# Patient Record
Sex: Female | Born: 1945 | Race: Black or African American | Hispanic: No | Marital: Married | Smoking: Never smoker
Health system: Southern US, Community
[De-identification: ages and names within clinical notes are randomized; demographics above are authoritative.]

## PROBLEM LIST (undated history)

## (undated) DIAGNOSIS — Z87442 Personal history of urinary calculi: Secondary | ICD-10-CM

## (undated) DIAGNOSIS — I10 Essential (primary) hypertension: Secondary | ICD-10-CM

## (undated) DIAGNOSIS — J45909 Unspecified asthma, uncomplicated: Secondary | ICD-10-CM

## (undated) DIAGNOSIS — G4730 Sleep apnea, unspecified: Secondary | ICD-10-CM

## (undated) DIAGNOSIS — M1990 Unspecified osteoarthritis, unspecified site: Secondary | ICD-10-CM

## (undated) DIAGNOSIS — J189 Pneumonia, unspecified organism: Secondary | ICD-10-CM

---

## 1968-05-18 HISTORY — PX: CHOLECYSTECTOMY: SHX55

## 1980-05-18 HISTORY — PX: BLADDER NECK SUSPENSION: SHX1240

## 1980-05-18 HISTORY — PX: ABDOMINAL HYSTERECTOMY: SHX81

## 1991-05-19 HISTORY — PX: BREAST SURGERY: SHX581

## 2010-05-18 HISTORY — PX: JOINT REPLACEMENT: SHX530

## 2010-12-29 ENCOUNTER — Encounter (HOSPITAL_COMMUNITY)
Admission: RE | Admit: 2010-12-29 | Discharge: 2010-12-29 | Disposition: A | Discharge status: Home / Self Care | Source: Ambulatory Visit | Attending: Orthopedic Surgery | Admitting: Orthopedic Surgery

## 2010-12-29 LAB — URINALYSIS, ROUTINE W REFLEX MICROSCOPIC
Bilirubin Urine: NEGATIVE
Glucose, UA: NEGATIVE mg/dL
Ketones, ur: NEGATIVE mg/dL
Leukocytes, UA: NEGATIVE
Nitrite: NEGATIVE
Protein, ur: NEGATIVE mg/dL
pH: 6 (ref 5.0–8.0)

## 2010-12-29 LAB — DIFFERENTIAL
Basophils Absolute: 0.1 10*3/uL (ref 0.0–0.1)
Basophils Relative: 1 % (ref 0–1)
Eosinophils Absolute: 0.3 10*3/uL (ref 0.0–0.7)
Eosinophils Relative: 4 % (ref 0–5)
Monocytes Absolute: 0.8 10*3/uL (ref 0.1–1.0)
Monocytes Relative: 10 % (ref 3–12)
Neutro Abs: 3.3 10*3/uL (ref 1.7–7.7)

## 2010-12-29 LAB — CBC
MCH: 29.5 pg (ref 26.0–34.0)
MCHC: 33.1 g/dL (ref 30.0–36.0)
RDW: 12.8 % (ref 11.5–15.5)

## 2010-12-29 LAB — BASIC METABOLIC PANEL
BUN: 13 mg/dL (ref 6–23)
Calcium: 10.4 mg/dL (ref 8.4–10.5)
Creatinine, Ser: 0.72 mg/dL (ref 0.50–1.10)
GFR calc Af Amer: >60 mL/min (ref >60)

## 2010-12-29 LAB — PROTIME-INR
INR: 0.95 (ref 0.00–1.49)
Prothrombin Time: 12.9 seconds (ref 11.6–15.2)

## 2010-12-31 LAB — URINE CULTURE: Colony Count: >=100000

## 2011-01-06 ENCOUNTER — Inpatient Hospital Stay (HOSPITAL_COMMUNITY)

## 2011-01-06 ENCOUNTER — Inpatient Hospital Stay (HOSPITAL_COMMUNITY)
Admission: RE | Admit: 2011-01-06 | Discharge: 2011-01-10 | DRG: 470 | Disposition: A | Discharge status: Home / Self Care | Source: Ambulatory Visit | Attending: Orthopedic Surgery | Admitting: Orthopedic Surgery

## 2011-01-06 DIAGNOSIS — M1710 Unilateral primary osteoarthritis, unspecified knee: Principal | ICD-10-CM | POA: Diagnosis present

## 2011-01-06 DIAGNOSIS — J45909 Unspecified asthma, uncomplicated: Secondary | ICD-10-CM | POA: Diagnosis present

## 2011-01-06 DIAGNOSIS — M810 Age-related osteoporosis without current pathological fracture: Secondary | ICD-10-CM | POA: Diagnosis present

## 2011-01-06 DIAGNOSIS — Z01812 Encounter for preprocedural laboratory examination: Secondary | ICD-10-CM

## 2011-01-06 DIAGNOSIS — I10 Essential (primary) hypertension: Secondary | ICD-10-CM | POA: Diagnosis present

## 2011-01-06 LAB — ABO/RH: ABO/RH(D): O POS

## 2011-01-06 LAB — TYPE AND SCREEN: ABO/RH(D): O POS

## 2011-01-07 LAB — BASIC METABOLIC PANEL
CO2: 24 mEq/L (ref 19–32)
Chloride: 102 mEq/L (ref 96–112)
Glucose, Bld: 131 mg/dL — ABNORMAL HIGH (ref 70–99)
Potassium: 3.9 mEq/L (ref 3.5–5.1)
Sodium: 135 mEq/L (ref 135–145)

## 2011-01-07 LAB — CBC
Hemoglobin: 10.1 g/dL — ABNORMAL LOW (ref 12.0–15.0)
Platelets: 384 10*3/uL (ref 150–400)
RBC: 3.39 MIL/uL — ABNORMAL LOW (ref 3.87–5.11)
WBC: 11.7 10*3/uL — ABNORMAL HIGH (ref 4.0–10.5)

## 2011-01-07 LAB — PROTIME-INR: Prothrombin Time: 13.8 seconds (ref 11.6–15.2)

## 2011-01-08 LAB — BASIC METABOLIC PANEL
BUN: 8 mg/dL (ref 6–23)
CO2: 27 mEq/L (ref 19–32)
Chloride: 102 mEq/L (ref 96–112)
GFR calc non Af Amer: >60 mL/min (ref >60)
Glucose, Bld: 159 mg/dL — ABNORMAL HIGH (ref 70–99)
Potassium: 4 mEq/L (ref 3.5–5.1)
Sodium: 136 mEq/L (ref 135–145)

## 2011-01-08 LAB — CBC
HCT: 31.9 % — ABNORMAL LOW (ref 36.0–46.0)
Hemoglobin: 10.6 g/dL — ABNORMAL LOW (ref 12.0–15.0)
MCHC: 33.2 g/dL (ref 30.0–36.0)
RBC: 3.61 MIL/uL — ABNORMAL LOW (ref 3.87–5.11)
WBC: 12.2 10*3/uL — ABNORMAL HIGH (ref 4.0–10.5)

## 2011-01-08 LAB — PROTIME-INR
INR: 1.14 (ref 0.00–1.49)
Prothrombin Time: 14.8 seconds (ref 11.6–15.2)

## 2011-01-09 LAB — CBC
HCT: 29 % — ABNORMAL LOW (ref 36.0–46.0)
Hemoglobin: 9.8 g/dL — ABNORMAL LOW (ref 12.0–15.0)
MCH: 29.4 pg (ref 26.0–34.0)
RBC: 3.33 MIL/uL — ABNORMAL LOW (ref 3.87–5.11)

## 2011-01-09 LAB — PROTIME-INR
INR: 1.42 (ref 0.00–1.49)
Prothrombin Time: 17.6 seconds — ABNORMAL HIGH (ref 11.6–15.2)

## 2011-01-10 LAB — PROTIME-INR: INR: 1.68 — ABNORMAL HIGH (ref 0.00–1.49)

## 2011-01-28 NOTE — Discharge Summary (Signed)
  NAMETOY, SAMARIN                   ACCOUNT NO.:  [id number]  MEDICAL RECORD NO.:  [id number]  LOCATION:  5012                         FACILITY:  [facility]  PHYSICIAN:  Burnard Bunting, M.D.    DATE OF BIRTH:  [birth year]  DATE OF ADMISSION:  01/06/2011 DATE OF DISCHARGE:  01/10/2011                              DISCHARGE SUMMARY   DISCHARGE DIAGNOSES:  Left knee arthritis.  SECONDARY DIAGNOSES:  Asthma, morbid obesity.  OPERATIONS AND NOTABLE PROCEDURES:  Left total knee replacement performed on January 06, 2011.  HOSPITAL COURSE:  Alexa Carson is a 65 year old patient with left knee arthritis, underwent total knee replacement on January 06, 2011, tolerated procedure well without immediate complications.  Started on CPM for range of motion therapy for mobilization and Coumadin for DVT prophylaxis.  Had an uneventful recovery.  Hemoglobin was greater than 10 on the time of discharge.  INR was near therapeutic at the time of discharge.  She is discharged home in good condition, weightbearing as tolerated.  Incision was intact at time of discharge.  He will follow up with me in 10 days for suture removal.  DISCHARGE MEDICATIONS:  Include 1. Vitamin D 1 tablet by mouth daily 2000 units. 2. Singulair 1 tablet by mouth daily. 3. Proventil inhaler 1-2 puffs inhaled every 6 hours as needed. 4. Fosamax 1 tablet by mouth weekly 70 mg on Saturday. 5. Albuterol nebulization 1 nebulization inhaler daily as needed. 6. Advair Diskus 100/50 two puffs inhaled every morning. 7. Lipitor 20 mg daily. 8. Lisinopril/hydrochlorothiazide 1 tablet by mouth daily. 9. Percocet 10/325 one p.o. q.3-4 hours p.r.n. pain. 10.Robaxin 500 mg p.o. q.8 hours p.r.n. spasm. 11.Coumadin 5 mg p.o. daily, INR 2-2.5.  She is discharged in good condition.     Burnard Bunting, M.D.     GSD/MEDQ  D:  01/09/2011  T:  01/09/2011  Job:  [id number]  Electronically Signed by Reece Agar.  [name] M.D. on 01/28/2011 08:32:45 AM

## 2011-01-28 NOTE — Op Note (Signed)
Alexa, Carson NO.:  [id number]  MEDICAL RECORD NO.:  [id number]  LOCATION:  5012                         FACILITY:  [facility]  PHYSICIAN:  Burnard Bunting, M.D.    DATE OF BIRTH:  [birth year]  DATE OF PROCEDURE:  01/06/2011 DATE OF DISCHARGE:                              OPERATIVE REPORT   PREOPERATIVE DIAGNOSIS:  Left knee arthritis with deformity.  POSTOPERATIVE DIAGNOSIS:  Left knee arthritis with deformity.  PROCEDURE:  Left total knee replacement.  SURGEON:  Burnard Bunting, MD  ASSISTANT:  Alexa Neighbors, PA  ANESTHESIA:  General endotracheal.  ESTIMATED BLOOD LOSS:  100 mL.  DRAINS:  Hemovac x1.  TOURNIQUET TIME:  120 minutes at 300 mmHg.  COMPONENTS UTILIZED:  DePuy rotating platform, 2.5 tibia, 2.5 femur, 35 patella, 15 poly, posterior cruciate sacrificing.  INDICATIONS:  Alexa Carson is a 65 year old patient with end-stage left knee arthritis and instability, presents for operative management after explanation of risks and benefits.  PROCEDURE IN DETAIL:  The patient was brought to the operating room where general endotracheal anesthesia was induced.  Preoperative antibiotics were administered.  Left leg was prescrubbed with alcohol and Betadine which was allowed to air dry and prepped and draped in DuraPrep solution including the foot and draped in a sterile manner. Time-out was called.  Alexa Carson was used to cover the operative field.  Leg was elevated and exsanguinated with the Esmarch wrap.  Tourniquet was inflated.  The anterior approach of the knee was utilized.  Skin and subcutaneous tissue was sharply divided.  The entire case was made more difficult by the patient's morbid obesity and by the patient's preoperative deformity with subluxation laterally of the tibia on the femur.  The anterior approach of the knee was utilized.  Skin and subcutaneous tissue was sharply divided.  Median parapatellar approach was made to the knee.   Precise location was marked with #1 Vicryl suture.  Patella was everted.  Osteophytes were removed.  Fat pad was partially excised.  Soft tissue was removed from the anterior distal femur.  Lateral patellofemoral ligament was released.  Two pins were placed in the medial and distal femur, proximal medial tibia via incisions.  The registration points were made including the hip center rotation, bimalleolar axis, and various points around the knee.  Tibia was then cut to perpendicular mechanical axis 2 mm off the most affected medial side.  An 8-mm cut was then made off the distal femur.  Chamfer and box cuts were then made.  Because of the patient's preoperative laxity and deformity, it was elected to perform a more stabilized tibial component.  This was drilled and trial reduction was then performed with the TC3 tibia.  This gave 30 mm of extra length on the stem in order to increase stability.  Trial components were placed.  The patient, with a 15-mm spacer, achieved full extension, full flexion, 2 degrees of valgus, excellent patellar tracking with no-thumbs technique and minimal lift-off past 90 degrees.  The patella was cut freehand from 20 mm to 12 and replaced with a 35-mm patellar button, trial components in position. Again, the patient had excellent  stability, tracking, and alignment with between 1 and 2 degrees of valgus alignment.  At this time, trial components were removed.  Three-liter bag of pulsatile irrigation was utilized.  True components were then cemented into position.  The tourniquet was released.  Same stability parameters were maintained with 15 poly.  The patient's bleeding points encountered which were electrocauterized with electrocautery.  Hemovac drain was placed. Excess cement was removed.  Incision was then closed using #1 Vicryl suture and reapproximated parapatellar arthrotomy followed by interrupted inverted 0 Vicryl suture, 2-0 Vicryl suture, 3-0, and  skin staples.  The incision sites for the pins were irrigated and closed using 3-0 nylon.  Solution of Marcaine and morphine finally injected in to the knee.  Alexa Carson's assistance was required at all times during the case for retraction of important neurovascular structures, drilling, sewing, opening, closing, as well as limb positioning, this was again made difficult by the patient's morbid obesity.  The patient tolerated the procedure well without immediate complication, had palpable pulse in her foot.  At the conclusion of the case, knee immobilizer and dressing were applied.     Burnard Bunting, M.D.     GSD/MEDQ  D:  01/06/2011  T:  01/06/2011  Job:  [id number]  Electronically Signed by Reece Agar.  DEAN M.D. on 01/28/2011 08:32:48 AM

## 2011-02-05 ENCOUNTER — Ambulatory Visit: Attending: Orthopedic Surgery | Admitting: Physical Therapy

## 2011-02-05 DIAGNOSIS — R262 Difficulty in walking, not elsewhere classified: Secondary | ICD-10-CM | POA: Insufficient documentation

## 2011-02-05 DIAGNOSIS — M25669 Stiffness of unspecified knee, not elsewhere classified: Secondary | ICD-10-CM | POA: Insufficient documentation

## 2011-02-05 DIAGNOSIS — M25569 Pain in unspecified knee: Secondary | ICD-10-CM | POA: Insufficient documentation

## 2011-02-05 DIAGNOSIS — IMO0001 Reserved for inherently not codable concepts without codable children: Secondary | ICD-10-CM | POA: Insufficient documentation

## 2011-02-05 DIAGNOSIS — M6281 Muscle weakness (generalized): Secondary | ICD-10-CM | POA: Insufficient documentation

## 2011-02-09 ENCOUNTER — Ambulatory Visit: Admitting: Physical Therapy

## 2011-02-11 ENCOUNTER — Ambulatory Visit: Admitting: Physical Therapy

## 2011-02-16 ENCOUNTER — Ambulatory Visit: Attending: Orthopedic Surgery | Admitting: Physical Therapy

## 2011-02-16 DIAGNOSIS — M25569 Pain in unspecified knee: Secondary | ICD-10-CM | POA: Insufficient documentation

## 2011-02-16 DIAGNOSIS — M6281 Muscle weakness (generalized): Secondary | ICD-10-CM | POA: Insufficient documentation

## 2011-02-16 DIAGNOSIS — R262 Difficulty in walking, not elsewhere classified: Secondary | ICD-10-CM | POA: Insufficient documentation

## 2011-02-16 DIAGNOSIS — IMO0001 Reserved for inherently not codable concepts without codable children: Secondary | ICD-10-CM | POA: Insufficient documentation

## 2011-02-16 DIAGNOSIS — M25669 Stiffness of unspecified knee, not elsewhere classified: Secondary | ICD-10-CM | POA: Insufficient documentation

## 2011-02-18 ENCOUNTER — Ambulatory Visit: Admitting: Physical Therapy

## 2011-02-23 ENCOUNTER — Ambulatory Visit: Admitting: Physical Therapy

## 2011-02-25 ENCOUNTER — Ambulatory Visit: Admitting: Physical Therapy

## 2011-03-02 ENCOUNTER — Encounter: Admitting: Physical Therapy

## 2011-03-04 ENCOUNTER — Encounter: Admitting: Physical Therapy

## 2013-03-03 ENCOUNTER — Other Ambulatory Visit: Payer: Self-pay | Admitting: Orthopedic Surgery

## 2013-03-23 ENCOUNTER — Encounter (HOSPITAL_COMMUNITY): Payer: Self-pay | Admitting: Pharmacy Technician

## 2013-03-25 NOTE — Pre-Procedure Instructions (Addendum)
Alexa Carson  03/25/2013   Your procedure is scheduled on:  November 18  Report to [facility] Entrance "A" [address] at [employer] AM.  Call this number if you have problems the morning of surgery: [phone]   Remember:   Do not eat food or drink liquids after midnight.   Take these medicines the morning of surgery with A SIP OF WATER: None use inhaler if needed   STOP Naproxen November 11   STOP Aspirin, Aleve, Naproxen, Advil, Ibuprofen, Vitamin, Herbs, or Supplements starting November 11   Do not wear jewelry, make-up or nail polish.  Do not wear lotions, powders, or perfumes. You may wear deodorant.  Do not shave 48 hours prior to surgery. Men may shave face and neck.  Do not bring valuables to the hospital.  [facility] is not responsible                  for any belongings or valuables.               Contacts, dentures or bridgework may not be worn into surgery.  Leave suitcase in the car. After surgery it may be brought to your room.  For patients admitted to the hospital, discharge time is determined by your                treatment team.               Special Instructions: Shower using CHG 2 nights before surgery and the night before surgery.  If you shower the day of surgery use CHG.  Use special wash - you have one bottle of CHG for all showers.  You should use approximately 1/3 of the bottle for each shower.   Please read over the following fact sheets that you were given: Pain Booklet, Coughing and Deep Breathing, Blood Transfusion Information, Total Joint Packet and Surgical Site Infection Prevention

## 2013-03-27 ENCOUNTER — Other Ambulatory Visit (HOSPITAL_COMMUNITY): Payer: Self-pay | Admitting: *Deleted

## 2013-03-27 ENCOUNTER — Encounter (HOSPITAL_COMMUNITY): Payer: Self-pay

## 2013-03-27 ENCOUNTER — Ambulatory Visit (HOSPITAL_COMMUNITY)
Admission: RE | Admit: 2013-03-27 | Discharge: 2013-03-27 | Disposition: A | Discharge status: Home / Self Care | Payer: Medicare Other | Source: Ambulatory Visit | Attending: Orthopedic Surgery | Admitting: Orthopedic Surgery

## 2013-03-27 ENCOUNTER — Encounter (HOSPITAL_COMMUNITY)
Admission: RE | Admit: 2013-03-27 | Discharge: 2013-03-27 | Disposition: A | Discharge status: Home / Self Care | Payer: Medicare Other | Source: Ambulatory Visit | Attending: Orthopedic Surgery | Admitting: Orthopedic Surgery

## 2013-03-27 DIAGNOSIS — R0602 Shortness of breath: Secondary | ICD-10-CM | POA: Insufficient documentation

## 2013-03-27 HISTORY — DX: Pneumonia, unspecified organism: J18.9

## 2013-03-27 HISTORY — DX: Unspecified osteoarthritis, unspecified site: M19.90

## 2013-03-27 HISTORY — DX: Sleep apnea, unspecified: G47.30

## 2013-03-27 HISTORY — DX: Unspecified asthma, uncomplicated: J45.909

## 2013-03-27 HISTORY — DX: Personal history of urinary calculi: Z87.442

## 2013-03-27 HISTORY — DX: Essential (primary) hypertension: I10

## 2013-03-27 LAB — BASIC METABOLIC PANEL
CO2: 24 mEq/L (ref 19–32)
Chloride: 102 mEq/L (ref 96–112)
Creatinine, Ser: 0.75 mg/dL (ref 0.50–1.10)
GFR calc Af Amer: >90 mL/min (ref >90)
GFR calc non Af Amer: 86 mL/min — ABNORMAL LOW (ref >90)
Sodium: 138 mEq/L (ref 135–145)

## 2013-03-27 LAB — URINALYSIS, ROUTINE W REFLEX MICROSCOPIC
Glucose, UA: NEGATIVE mg/dL
Hgb urine dipstick: NEGATIVE
Leukocytes, UA: NEGATIVE
Urobilinogen, UA: 0.2 mg/dL (ref 0.0–1.0)
pH: 5.5 (ref 5.0–8.0)

## 2013-03-27 LAB — CBC
HCT: 39 % (ref 36.0–46.0)
MCHC: 33.6 g/dL (ref 30.0–36.0)
MCV: 89.4 fL (ref 78.0–100.0)
RDW: 12.8 % (ref 11.5–15.5)
WBC: 7.1 10*3/uL (ref 4.0–10.5)

## 2013-03-27 LAB — TYPE AND SCREEN
ABO/RH(D): O POS
Antibody Screen: NEGATIVE

## 2013-03-27 LAB — SURGICAL PCR SCREEN
MRSA, PCR: NEGATIVE
Staphylococcus aureus: NEGATIVE

## 2013-03-28 ENCOUNTER — Other Ambulatory Visit (HOSPITAL_COMMUNITY): Payer: Self-pay | Admitting: Orthopedic Surgery

## 2013-03-28 LAB — URINE CULTURE: Culture: >=100000

## 2013-03-31 ENCOUNTER — Other Ambulatory Visit (HOSPITAL_COMMUNITY): Payer: Self-pay | Admitting: Orthopedic Surgery

## 2013-04-03 ENCOUNTER — Encounter (HOSPITAL_COMMUNITY)
Admission: RE | Admit: 2013-04-03 | Discharge: 2013-04-03 | Disposition: A | Discharge status: Home / Self Care | Payer: Medicare Other | Source: Ambulatory Visit | Attending: Orthopedic Surgery | Admitting: Orthopedic Surgery

## 2013-04-03 LAB — URINALYSIS, ROUTINE W REFLEX MICROSCOPIC
Bilirubin Urine: NEGATIVE
Glucose, UA: NEGATIVE mg/dL
Hgb urine dipstick: NEGATIVE
Leukocytes, UA: NEGATIVE
Nitrite: NEGATIVE
Specific Gravity, Urine: 1.017 (ref 1.005–1.030)
pH: 5.5 (ref 5.0–8.0)

## 2013-04-03 MED ORDER — CLINDAMYCIN PHOSPHATE 900 MG/50ML IV SOLN
900.0000 mg | INTRAVENOUS | Status: AC
  Administered 2013-04-04: 900 mg via INTRAVENOUS
  Filled 2013-04-03: qty 50

## 2013-04-03 NOTE — H&P (Addendum)
TOTAL KNEE ADMISSION H&P  Patient is being admitted for right otal knee arthroplasty.  Subjective:  Chief Complaint:right knee pain.  HPI: Alexa Carson, 67 y.o. female, has a history of pain and functional disability in the right knee due to arthritis and has failed non-surgical conservative treatments for greater than 12 weeks to includeNSAID's and/or analgesics, corticosteriod injections, viscosupplementation injections, use of assistive devices, weight reduction as appropriate and activity modification.  Onset of symptoms was gradual, starting >10 years ago with gradually worsening course since that time. The patient noted no past surgery on the right knee(s).  Patient currently rates pain in the right knee(s) at 9 out of 10 with activity. Patient has night pain, worsening of pain with activity and weight bearing, pain that interferes with activities of daily living, pain with passive range of motion, crepitus and joint swelling.  Patient has evidence of subchondral cysts, subchondral sclerosis, periarticular osteophytes, joint subluxation and joint space narrowing by imaging studies. This patient has had a good result with left TKA. There is no active infection.  There are no active problems to display for this patient.  Past Medical History  Diagnosis Date  . Hypertension   . Asthma   . Sleep apnea     cpap  . Pneumonia     child  . History of kidney stones   . Arthritis     Past Surgical History  Procedure Laterality Date  . Abdominal hysterectomy  82  . Bladder neck suspension  82  . Cholecystectomy  70  . Breast surgery  93    augmentation  . Joint replacement  12    left knee    No prescriptions prior to admission   Allergies  Allergen Reactions  . Augmentin [Amoxicillin-Pot Clavulanate]   . Penicillins   . Sulfa Antibiotics   . Latex Rash    Burn with adhesive    History  Substance Use Topics  . Smoking status: Never Smoker   . Smokeless tobacco: Not on file  .  Alcohol Use: No    No family history on file.   Review of Systems  Constitutional: Negative.   HENT: Negative.   Eyes: Negative.   Respiratory: Positive for wheezing.   Cardiovascular: Negative.   Gastrointestinal: Negative.   Genitourinary: Negative.   Musculoskeletal: Positive for joint pain.  Skin: Negative.   Neurological: Negative.   Endo/Heme/Allergies: Negative.   Psychiatric/Behavioral: Negative.     Objective:  Physical Exam  Constitutional: She appears well-developed.  HENT:  Head: Normocephalic.  Eyes: Pupils are equal, round, and reactive to light.  Neck: Normal range of motion.  Cardiovascular: Normal rate.   Respiratory: Effort normal.  GI: Soft.  Neurological: She is alert.  Skin: Skin is warm.  Psychiatric: She has a normal mood and affect.  DP 2/4 - rom 5 - 90 - collaterals stable - skin intact right knee  Vital signs in last 24 hours:    Labs:   There is no height or weight on file to calculate BMI.   Imaging Review Plain radiographs demonstrate severe degenerative joint disease of the right knee(s). The overall alignment ismild varus. The bone quality appears to be good for age and reported activity level.  Assessment/Plan:  End stage arthritis, right knee   The patient history, physical examination, clinical judgment of the provider and imaging studies are consistent with end stage degenerative joint disease of the right knee(s) and total knee arthroplasty is deemed medically necessary. The treatment options including  medical management, injection therapy arthroscopy and arthroplasty were discussed at length. The risks and benefits of total knee arthroplasty were presented and reviewed. The risks due to aseptic loosening, infection, stiffness, patella tracking problems, thromboembolic complications and other imponderables were discussed. The patient acknowledged the explanation, agreed to proceed with the plan and consent was signed. Patient is  being admitted for inpatient treatment for surgery, pain control, PT, OT, prophylactic antibiotics, VTE prophylaxis, progressive ambulation and ADL's and discharge planning. The patient is planning to be discharged home with home health services Patient has been pleased with her left total knee replacement

## 2013-04-04 ENCOUNTER — Encounter (HOSPITAL_COMMUNITY)
Admission: RE | Disposition: A | Discharge status: Home Health | Payer: Self-pay | Source: Ambulatory Visit | Attending: Orthopedic Surgery

## 2013-04-04 ENCOUNTER — Inpatient Hospital Stay (HOSPITAL_COMMUNITY): Payer: Medicare Other | Admitting: Anesthesiology

## 2013-04-04 ENCOUNTER — Encounter (HOSPITAL_COMMUNITY): Payer: Self-pay | Admitting: *Deleted

## 2013-04-04 ENCOUNTER — Inpatient Hospital Stay (HOSPITAL_COMMUNITY)
Admission: RE | Admit: 2013-04-04 | Discharge: 2013-04-07 | DRG: 470 | Disposition: A | Discharge status: Home Health | Payer: Medicare Other | Source: Ambulatory Visit | Attending: Orthopedic Surgery | Admitting: Orthopedic Surgery

## 2013-04-04 ENCOUNTER — Encounter (HOSPITAL_COMMUNITY): Payer: Medicare Other | Admitting: Anesthesiology

## 2013-04-04 DIAGNOSIS — I10 Essential (primary) hypertension: Secondary | ICD-10-CM | POA: Diagnosis present

## 2013-04-04 DIAGNOSIS — Z91040 Latex allergy status: Secondary | ICD-10-CM

## 2013-04-04 DIAGNOSIS — M1710 Unilateral primary osteoarthritis, unspecified knee: Principal | ICD-10-CM | POA: Diagnosis present

## 2013-04-04 DIAGNOSIS — G8918 Other acute postprocedural pain: Secondary | ICD-10-CM | POA: Diagnosis not present

## 2013-04-04 DIAGNOSIS — Z880 Allergy status to penicillin: Secondary | ICD-10-CM

## 2013-04-04 DIAGNOSIS — Z23 Encounter for immunization: Secondary | ICD-10-CM

## 2013-04-04 DIAGNOSIS — J45909 Unspecified asthma, uncomplicated: Secondary | ICD-10-CM | POA: Diagnosis present

## 2013-04-04 DIAGNOSIS — Z882 Allergy status to sulfonamides status: Secondary | ICD-10-CM

## 2013-04-04 DIAGNOSIS — M1711 Unilateral primary osteoarthritis, right knee: Secondary | ICD-10-CM

## 2013-04-04 DIAGNOSIS — G4730 Sleep apnea, unspecified: Secondary | ICD-10-CM | POA: Diagnosis present

## 2013-04-04 DIAGNOSIS — Z87442 Personal history of urinary calculi: Secondary | ICD-10-CM

## 2013-04-04 DIAGNOSIS — Z96659 Presence of unspecified artificial knee joint: Secondary | ICD-10-CM

## 2013-04-04 DIAGNOSIS — Z01812 Encounter for preprocedural laboratory examination: Secondary | ICD-10-CM

## 2013-04-04 DIAGNOSIS — Z9089 Acquired absence of other organs: Secondary | ICD-10-CM

## 2013-04-04 HISTORY — PX: TOTAL KNEE ARTHROPLASTY: SHX125

## 2013-04-04 SURGERY — ARTHROPLASTY, KNEE, TOTAL
Anesthesia: General | Site: Knee | Laterality: Right | Wound class: Clean

## 2013-04-04 MED ORDER — CLONIDINE HCL (ANALGESIA) 100 MCG/ML EP SOLN
EPIDURAL | Status: DC | PRN
  Administered 2013-04-04: 1 mL

## 2013-04-04 MED ORDER — MORPHINE SULFATE 4 MG/ML IJ SOLN
INTRAMUSCULAR | Status: AC
  Filled 2013-04-04: qty 2

## 2013-04-04 MED ORDER — ACETAMINOPHEN 325 MG PO TABS: 650.0000 mg | ORAL_TABLET | Freq: Four times a day (QID) | ORAL | Status: DC | PRN

## 2013-04-04 MED ORDER — CELECOXIB 200 MG PO CAPS
200.0000 mg | ORAL_CAPSULE | Freq: Every day | ORAL | Status: DC
  Administered 2013-04-04 – 2013-04-07 (×4): 200 mg via ORAL
  Filled 2013-04-04 (×4): qty 1

## 2013-04-04 MED ORDER — ONDANSETRON HCL 4 MG PO TABS: 4.0000 mg | ORAL_TABLET | Freq: Four times a day (QID) | ORAL | Status: DC | PRN

## 2013-04-04 MED ORDER — HYDROMORPHONE HCL PF 1 MG/ML IJ SOLN
INTRAMUSCULAR | Status: AC
  Filled 2013-04-04: qty 1

## 2013-04-04 MED ORDER — METOCLOPRAMIDE HCL 10 MG PO TABS: 5.0000 mg | ORAL_TABLET | Freq: Three times a day (TID) | ORAL | Status: DC | PRN

## 2013-04-04 MED ORDER — PHENOL 1.4 % MT LIQD: 1.0000 | OROMUCOSAL | Status: DC | PRN

## 2013-04-04 MED ORDER — DEXTROSE 5 % IV SOLN
INTRAVENOUS | Status: DC | PRN
  Administered 2013-04-04: 08:00:00 via INTRAVENOUS

## 2013-04-04 MED ORDER — POTASSIUM CHLORIDE IN NACL 20-0.9 MEQ/L-% IV SOLN
INTRAVENOUS | Status: AC
  Administered 2013-04-04 – 2013-04-05 (×2): via INTRAVENOUS
  Filled 2013-04-04 (×2): qty 1000

## 2013-04-04 MED ORDER — MENTHOL 3 MG MT LOZG: 1.0000 | LOZENGE | OROMUCOSAL | Status: DC | PRN

## 2013-04-04 MED ORDER — CLINDAMYCIN PHOSPHATE 600 MG/50ML IV SOLN
600.0000 mg | Freq: Four times a day (QID) | INTRAVENOUS | Status: AC
  Administered 2013-04-04 (×2): 600 mg via INTRAVENOUS
  Filled 2013-04-04 (×2): qty 50

## 2013-04-04 MED ORDER — CHLORHEXIDINE GLUCONATE 4 % EX LIQD: 60.0000 mL | Freq: Once | CUTANEOUS | Status: DC

## 2013-04-04 MED ORDER — PROMETHAZINE HCL 25 MG/ML IJ SOLN: 6.2500 mg | INTRAMUSCULAR | Status: DC | PRN

## 2013-04-04 MED ORDER — HYDROCHLOROTHIAZIDE 12.5 MG PO CAPS
12.5000 mg | ORAL_CAPSULE | Freq: Every day | ORAL | Status: DC
  Administered 2013-04-04 – 2013-04-07 (×4): 12.5 mg via ORAL
  Filled 2013-04-04 (×4): qty 1

## 2013-04-04 MED ORDER — ONDANSETRON HCL 4 MG/2ML IJ SOLN
INTRAMUSCULAR | Status: DC | PRN
  Administered 2013-04-04: 4 mg via INTRAVENOUS

## 2013-04-04 MED ORDER — SODIUM CHLORIDE 0.9 % IJ SOLN
INTRAMUSCULAR | Status: DC | PRN
  Administered 2013-04-04: 08:00:00

## 2013-04-04 MED ORDER — LIDOCAINE HCL (CARDIAC) 20 MG/ML IV SOLN
INTRAVENOUS | Status: DC | PRN
  Administered 2013-04-04: 80 mg via INTRAVENOUS

## 2013-04-04 MED ORDER — MOMETASONE FURO-FORMOTEROL FUM 200-5 MCG/ACT IN AERO
2.0000 | INHALATION_SPRAY | Freq: Two times a day (BID) | RESPIRATORY_TRACT | Status: DC
  Administered 2013-04-04 – 2013-04-07 (×6): 2 via RESPIRATORY_TRACT
  Filled 2013-04-04: qty 8.8

## 2013-04-04 MED ORDER — BUPIVACAINE HCL (PF) 0.25 % IJ SOLN
INTRAMUSCULAR | Status: AC
  Filled 2013-04-04: qty 30

## 2013-04-04 MED ORDER — SODIUM CHLORIDE 0.9 % IR SOLN
Status: DC | PRN
  Administered 2013-04-04: 3000 mL

## 2013-04-04 MED ORDER — MIDAZOLAM HCL 5 MG/5ML IJ SOLN
INTRAMUSCULAR | Status: DC | PRN
  Administered 2013-04-04: 1 mg via INTRAVENOUS

## 2013-04-04 MED ORDER — FENTANYL CITRATE 0.05 MG/ML IJ SOLN
INTRAMUSCULAR | Status: DC | PRN
  Administered 2013-04-04 (×2): 50 ug via INTRAVENOUS
  Administered 2013-04-04: 25 ug via INTRAVENOUS
  Administered 2013-04-04 (×3): 50 ug via INTRAVENOUS
  Administered 2013-04-04: 75 ug via INTRAVENOUS

## 2013-04-04 MED ORDER — ACETAMINOPHEN 650 MG RE SUPP: 650.0000 mg | Freq: Four times a day (QID) | RECTAL | Status: DC | PRN

## 2013-04-04 MED ORDER — MONTELUKAST SODIUM 10 MG PO TABS
10.0000 mg | ORAL_TABLET | Freq: Every day | ORAL | Status: DC
  Administered 2013-04-04 – 2013-04-06 (×3): 10 mg via ORAL
  Filled 2013-04-04 (×4): qty 1

## 2013-04-04 MED ORDER — METOCLOPRAMIDE HCL 5 MG/ML IJ SOLN: 5.0000 mg | Freq: Three times a day (TID) | INTRAMUSCULAR | Status: DC | PRN

## 2013-04-04 MED ORDER — WARFARIN SODIUM 7.5 MG PO TABS
7.5000 mg | ORAL_TABLET | Freq: Once | ORAL | Status: AC
  Administered 2013-04-04: 7.5 mg via ORAL
  Filled 2013-04-04: qty 1

## 2013-04-04 MED ORDER — BUPIVACAINE LIPOSOME 1.3 % IJ SUSP
20.0000 mL | Freq: Once | INTRAMUSCULAR | Status: DC
  Filled 2013-04-04 (×2): qty 20

## 2013-04-04 MED ORDER — WARFARIN - PHARMACIST DOSING INPATIENT: Freq: Every day | Status: DC

## 2013-04-04 MED ORDER — WARFARIN VIDEO
Freq: Once | Status: AC
  Administered 2013-04-06: 18:00:00

## 2013-04-04 MED ORDER — OXYCODONE HCL 5 MG PO TABS
ORAL_TABLET | ORAL | Status: AC
  Administered 2013-04-04: 5 mg
  Filled 2013-04-04: qty 1

## 2013-04-04 MED ORDER — METHOCARBAMOL 500 MG PO TABS
ORAL_TABLET | ORAL | Status: AC
  Administered 2013-04-04: 500 mg
  Filled 2013-04-04: qty 1

## 2013-04-04 MED ORDER — LACTATED RINGERS IV SOLN
INTRAVENOUS | Status: DC | PRN
  Administered 2013-04-04 (×3): via INTRAVENOUS

## 2013-04-04 MED ORDER — METHOCARBAMOL 500 MG PO TABS
500.0000 mg | ORAL_TABLET | Freq: Four times a day (QID) | ORAL | Status: DC | PRN
  Administered 2013-04-05 – 2013-04-07 (×2): 500 mg via ORAL
  Filled 2013-04-04 (×2): qty 1

## 2013-04-04 MED ORDER — DOCUSATE SODIUM 100 MG PO CAPS
100.0000 mg | ORAL_CAPSULE | Freq: Two times a day (BID) | ORAL | Status: DC
  Administered 2013-04-04 – 2013-04-07 (×6): 100 mg via ORAL
  Filled 2013-04-04 (×7): qty 1

## 2013-04-04 MED ORDER — LISINOPRIL-HYDROCHLOROTHIAZIDE 20-12.5 MG PO TABS: 1.0000 | ORAL_TABLET | Freq: Every day | ORAL | Status: DC

## 2013-04-04 MED ORDER — OXYCODONE HCL ER 10 MG PO T12A
10.0000 mg | EXTENDED_RELEASE_TABLET | Freq: Two times a day (BID) | ORAL | Status: DC
  Administered 2013-04-04 – 2013-04-07 (×6): 10 mg via ORAL
  Filled 2013-04-04 (×6): qty 1

## 2013-04-04 MED ORDER — BUPIVACAINE HCL (PF) 0.25 % IJ SOLN
INTRAMUSCULAR | Status: DC | PRN
  Administered 2013-04-04: 30 mL

## 2013-04-04 MED ORDER — ALBUTEROL SULFATE HFA 108 (90 BASE) MCG/ACT IN AERS: 2.0000 | INHALATION_SPRAY | Freq: Four times a day (QID) | RESPIRATORY_TRACT | Status: DC | PRN

## 2013-04-04 MED ORDER — OXYCODONE HCL 5 MG PO TABS
5.0000 mg | ORAL_TABLET | Freq: Once | ORAL | Status: DC | PRN
Start: 2013-04-04 — End: 2013-04-04

## 2013-04-04 MED ORDER — NEOSTIGMINE METHYLSULFATE 1 MG/ML IJ SOLN
INTRAMUSCULAR | Status: DC | PRN
  Administered 2013-04-04: 2 mg via INTRAVENOUS
  Administered 2013-04-04: 3 mg via INTRAVENOUS

## 2013-04-04 MED ORDER — ALENDRONATE SODIUM 70 MG PO TABS: 70.0000 mg | ORAL_TABLET | ORAL | Status: DC

## 2013-04-04 MED ORDER — ATORVASTATIN CALCIUM 20 MG PO TABS
20.0000 mg | ORAL_TABLET | Freq: Every day | ORAL | Status: DC
  Administered 2013-04-04 – 2013-04-07 (×4): 20 mg via ORAL
  Filled 2013-04-04 (×4): qty 1

## 2013-04-04 MED ORDER — PROPOFOL 10 MG/ML IV BOLUS
INTRAVENOUS | Status: DC | PRN
  Administered 2013-04-04: 160 mg via INTRAVENOUS

## 2013-04-04 MED ORDER — METHOCARBAMOL 100 MG/ML IJ SOLN
500.0000 mg | Freq: Four times a day (QID) | INTRAVENOUS | Status: DC | PRN
  Filled 2013-04-04: qty 5

## 2013-04-04 MED ORDER — COUMADIN BOOK
Freq: Once | Status: AC
  Administered 2013-04-04: 18:00:00
  Filled 2013-04-04: qty 1

## 2013-04-04 MED ORDER — HYDROMORPHONE HCL PF 1 MG/ML IJ SOLN: 1.0000 mg | INTRAMUSCULAR | Status: DC | PRN

## 2013-04-04 MED ORDER — CLONIDINE HCL (ANALGESIA) 100 MCG/ML EP SOLN
150.0000 ug | Freq: Once | EPIDURAL | Status: DC
  Filled 2013-04-04: qty 1.5

## 2013-04-04 MED ORDER — LISINOPRIL 20 MG PO TABS
20.0000 mg | ORAL_TABLET | Freq: Every day | ORAL | Status: DC
  Administered 2013-04-04 – 2013-04-07 (×4): 20 mg via ORAL
  Filled 2013-04-04 (×4): qty 1

## 2013-04-04 MED ORDER — OXYCODONE HCL 5 MG PO TABS
5.0000 mg | ORAL_TABLET | ORAL | Status: DC | PRN
  Administered 2013-04-04: 5 mg via ORAL
  Administered 2013-04-05: 10 mg via ORAL
  Administered 2013-04-05: 5 mg via ORAL
  Administered 2013-04-05 – 2013-04-06 (×4): 10 mg via ORAL
  Administered 2013-04-06: 5 mg via ORAL
  Administered 2013-04-07 (×2): 10 mg via ORAL
  Filled 2013-04-04 (×2): qty 2
  Filled 2013-04-04 (×2): qty 1
  Filled 2013-04-04: qty 2
  Filled 2013-04-04: qty 1
  Filled 2013-04-04 (×4): qty 2

## 2013-04-04 MED ORDER — HYDROMORPHONE HCL PF 1 MG/ML IJ SOLN
0.2500 mg | INTRAMUSCULAR | Status: DC | PRN
  Administered 2013-04-04 (×2): 0.5 mg via INTRAVENOUS

## 2013-04-04 MED ORDER — MORPHINE SULFATE 4 MG/ML IJ SOLN
INTRAMUSCULAR | Status: DC | PRN
  Administered 2013-04-04: 8 mg

## 2013-04-04 MED ORDER — GLYCOPYRROLATE 0.2 MG/ML IJ SOLN
INTRAMUSCULAR | Status: DC | PRN
  Administered 2013-04-04: .5 mg via INTRAVENOUS
  Administered 2013-04-04: 0.3 mg via INTRAVENOUS

## 2013-04-04 MED ORDER — OXYCODONE HCL 5 MG/5ML PO SOLN: 5.0000 mg | Freq: Once | ORAL | Status: DC | PRN

## 2013-04-04 MED ORDER — VITAMIN D3 25 MCG (1000 UNIT) PO TABS
4000.0000 [IU] | ORAL_TABLET | Freq: Every day | ORAL | Status: DC
  Administered 2013-04-05 – 2013-04-07 (×3): 4000 [IU] via ORAL
  Filled 2013-04-04 (×4): qty 4

## 2013-04-04 MED ORDER — ONDANSETRON HCL 4 MG/2ML IJ SOLN: 4.0000 mg | Freq: Four times a day (QID) | INTRAMUSCULAR | Status: DC | PRN

## 2013-04-04 MED ORDER — SODIUM CHLORIDE 0.9 % IJ SOLN
INTRAMUSCULAR | Status: DC | PRN
  Administered 2013-04-04: 40 mL

## 2013-04-04 MED ORDER — PNEUMOCOCCAL VAC POLYVALENT 25 MCG/0.5ML IJ INJ
0.5000 mL | INJECTION | INTRAMUSCULAR | Status: AC
  Administered 2013-04-07: 0.5 mL via INTRAMUSCULAR
  Filled 2013-04-04 (×2): qty 0.5

## 2013-04-04 MED ORDER — ROCURONIUM BROMIDE 100 MG/10ML IV SOLN
INTRAVENOUS | Status: DC | PRN
  Administered 2013-04-04: 50 mg via INTRAVENOUS

## 2013-04-04 MED ORDER — SUCCINYLCHOLINE CHLORIDE 20 MG/ML IJ SOLN
INTRAMUSCULAR | Status: DC | PRN
  Administered 2013-04-04: 120 mg via INTRAVENOUS

## 2013-04-04 SURGICAL SUPPLY — 75 items
BANDAGE ELASTIC 4 VELCRO ST LF (GAUZE/BANDAGES/DRESSINGS) ×2 IMPLANT
BANDAGE ELASTIC 6 VELCRO ST LF (GAUZE/BANDAGES/DRESSINGS) ×2 IMPLANT
BANDAGE ESMARK 6X9 LF (GAUZE/BANDAGES/DRESSINGS) ×1 IMPLANT
BLADE SAG 18X100X1.27 (BLADE) ×2 IMPLANT
BLADE SAW SGTL 13.0X1.19X90.0M (BLADE) ×2 IMPLANT
BNDG COHESIVE 6X5 TAN STRL LF (GAUZE/BANDAGES/DRESSINGS) ×2 IMPLANT
BNDG ELASTIC 6X10 VLCR STRL LF (GAUZE/BANDAGES/DRESSINGS) ×6 IMPLANT
BNDG ELASTIC 6X15 VLCR STRL LF (GAUZE/BANDAGES/DRESSINGS) ×2 IMPLANT
BNDG ESMARK 6X9 LF (GAUZE/BANDAGES/DRESSINGS) ×2
BOWL SMART MIX CTS (DISPOSABLE) ×2 IMPLANT
CEMENT BONE SIMPLEX SPEEDSET (Cement) ×4 IMPLANT
CLOTH BEACON ORANGE TIMEOUT ST (SAFETY) ×2 IMPLANT
COVER SURGICAL LIGHT HANDLE (MISCELLANEOUS) ×2 IMPLANT
CUFF TOURNIQUET SINGLE 34IN LL (TOURNIQUET CUFF) IMPLANT
CUFF TOURNIQUET SINGLE 44IN (TOURNIQUET CUFF) IMPLANT
DRAPE INCISE IOBAN 66X45 STRL (DRAPES) ×4 IMPLANT
DRAPE ORTHO SPLIT 77X108 STRL (DRAPES) ×3
DRAPE SURG ORHT 6 SPLT 77X108 (DRAPES) ×3 IMPLANT
DRAPE U-SHAPE 47X51 STRL (DRAPES) ×2 IMPLANT
DRSG PAD ABDOMINAL 8X10 ST (GAUZE/BANDAGES/DRESSINGS) ×2 IMPLANT
DURAPREP 26ML APPLICATOR (WOUND CARE) ×2 IMPLANT
ELECT REM PT RETURN 9FT ADLT (ELECTROSURGICAL) ×2
ELECTRODE REM PT RTRN 9FT ADLT (ELECTROSURGICAL) ×1 IMPLANT
FACESHIELD LNG OPTICON STERILE (SAFETY) ×2 IMPLANT
GAUZE XEROFORM 5X9 LF (GAUZE/BANDAGES/DRESSINGS) ×2 IMPLANT
GLOVE BIOGEL PI IND STRL 7.5 (GLOVE) ×1 IMPLANT
GLOVE BIOGEL PI IND STRL 8 (GLOVE) ×1 IMPLANT
GLOVE BIOGEL PI INDICATOR 7.5 (GLOVE) ×1
GLOVE BIOGEL PI INDICATOR 8 (GLOVE) ×1
GLOVE ECLIPSE 7.0 STRL STRAW (GLOVE) ×4 IMPLANT
GLOVE SURG ORTHO 8.0 STRL STRW (GLOVE) ×2 IMPLANT
GOWN PREVENTION PLUS LG XLONG (DISPOSABLE) ×2 IMPLANT
GOWN PREVENTION PLUS XLARGE (GOWN DISPOSABLE) ×2 IMPLANT
GOWN STRL NON-REIN LRG LVL3 (GOWN DISPOSABLE) ×6 IMPLANT
HANDPIECE INTERPULSE COAX TIP (DISPOSABLE) ×1
HOOD PEEL AWAY FACE SHEILD DIS (HOOD) ×6 IMPLANT
IMMOBILIZER KNEE 20 (SOFTGOODS)
IMMOBILIZER KNEE 20 THIGH 36 (SOFTGOODS) IMPLANT
IMMOBILIZER KNEE 22 UNIV (SOFTGOODS) ×2 IMPLANT
IMMOBILIZER KNEE 24 THIGH 36 (MISCELLANEOUS) IMPLANT
IMMOBILIZER KNEE 24 UNIV (MISCELLANEOUS)
KIT BASIN OR (CUSTOM PROCEDURE TRAY) ×2 IMPLANT
KIT ROOM TURNOVER OR (KITS) ×2 IMPLANT
KNEE LEVEL 1C ×2 IMPLANT
MANIFOLD NEPTUNE II (INSTRUMENTS) ×2 IMPLANT
NEEDLE 18GX1X1/2 (RX/OR ONLY) (NEEDLE) ×2 IMPLANT
NEEDLE 21X1 OR PACK (NEEDLE) ×2 IMPLANT
NEEDLE SPNL 18GX3.5 QUINCKE PK (NEEDLE) ×2 IMPLANT
NS IRRIG 1000ML POUR BTL (IV SOLUTION) ×4 IMPLANT
PACK TOTAL JOINT (CUSTOM PROCEDURE TRAY) ×2 IMPLANT
PAD ARMBOARD 7.5X6 YLW CONV (MISCELLANEOUS) ×4 IMPLANT
PAD CAST 4YDX4 CTTN HI CHSV (CAST SUPPLIES) ×1 IMPLANT
PADDING CAST COTTON 4X4 STRL (CAST SUPPLIES) ×1
PADDING CAST COTTON 6X4 STRL (CAST SUPPLIES) ×2 IMPLANT
RUBBERBAND STERILE (MISCELLANEOUS) ×2 IMPLANT
SET HNDPC FAN SPRY TIP SCT (DISPOSABLE) ×1 IMPLANT
SPONGE GAUZE 4X4 12PLY (GAUZE/BANDAGES/DRESSINGS) ×2 IMPLANT
SPONGE LAP 18X18 X RAY DECT (DISPOSABLE) IMPLANT
STAPLER VISISTAT 35W (STAPLE) ×2 IMPLANT
STEM CEMENTED TRIATHLON (Stem) ×2 IMPLANT
SUCTION FRAZIER TIP 10 FR DISP (SUCTIONS) ×2 IMPLANT
SUT ETHILON 3 0 PS 1 (SUTURE) ×4 IMPLANT
SUT VIC AB 0 CTB1 27 (SUTURE) ×6 IMPLANT
SUT VIC AB 1 CT1 27 (SUTURE) ×5
SUT VIC AB 1 CT1 27XBRD ANBCTR (SUTURE) ×5 IMPLANT
SUT VIC AB 2-0 CT1 27 (SUTURE) ×2
SUT VIC AB 2-0 CT1 TAPERPNT 27 (SUTURE) ×2 IMPLANT
SYR 30ML LL (SYRINGE) ×2 IMPLANT
SYR 30ML SLIP (SYRINGE) ×2 IMPLANT
SYR 50ML LL SCALE MARK (SYRINGE) ×2 IMPLANT
SYR TB 1ML LUER SLIP (SYRINGE) ×2 IMPLANT
TOWEL OR 17X24 6PK STRL BLUE (TOWEL DISPOSABLE) ×2 IMPLANT
TOWEL OR 17X26 10 PK STRL BLUE (TOWEL DISPOSABLE) ×4 IMPLANT
TRAY FOLEY CATH 16FRSI W/METER (SET/KITS/TRAYS/PACK) ×2 IMPLANT
WATER STERILE IRR 1000ML POUR (IV SOLUTION) ×4 IMPLANT

## 2013-04-04 NOTE — Anesthesia Preprocedure Evaluation (Addendum)
Anesthesia Evaluation  Patient identified by MRN, date of birth, ID band Patient awake    Reviewed: Allergy & Precautions, H&P , NPO status , Patient's Chart, lab work & pertinent test results, reviewed documented beta blocker date and time   Airway Mallampati: II TM Distance: >3 FB Neck ROM: Full    Dental  (+) Missing and Dental Advisory Given   Pulmonary asthma , sleep apnea and Continuous Positive Airway Pressure Ventilation ,    + decreased breath sounds      Cardiovascular hypertension, Pt. on medications Rhythm:Regular Rate:Normal     Neuro/Psych    GI/Hepatic   Endo/Other  Morbid obesity  Renal/GU      Musculoskeletal   Abdominal   Peds  Hematology   Anesthesia Other Findings   Reproductive/Obstetrics                         Anesthesia Physical Anesthesia Plan  ASA: III  Anesthesia Plan: General   Post-op Pain Management:    Induction: Intravenous  Airway Management Planned: Oral ETT  Additional Equipment:   Intra-op Plan:   Post-operative Plan: Possible Post-op intubation/ventilation  Informed Consent: I have reviewed the patients History and Physical, chart, labs and discussed the procedure including the risks, benefits and alternatives for the proposed anesthesia with the patient or authorized representative who has indicated his/her understanding and acceptance.   Dental advisory given  Plan Discussed with: CRNA and Surgeon  Anesthesia Plan Comments:         Anesthesia Quick Evaluation

## 2013-04-04 NOTE — Transfer of Care (Signed)
Immediate Anesthesia Transfer of Care Note  [name]  Procedure(s) Performed: Procedure(s): TOTAL KNEE ARTHROPLASTY (Right)  Patient Location: PACU  Anesthesia Type:General  Level of Consciousness: awake and patient cooperative  Airway & Oxygen Therapy: Patient Spontanous Breathing and Patient connected to face mask oxygen  Post-op Assessment: Report given to PACU RN and Post -op Vital signs reviewed and stable  Post vital signs: Reviewed and stable  Complications: No apparent anesthesia complications

## 2013-04-04 NOTE — Interval H&P Note (Signed)
History and Physical Interval Note:  04/04/2013 7:25 AM  Alexa Carson  has presented today for surgery, with the diagnosis of RIGHT TOTAL KNEE ARTHROPLASTY  The various methods of treatment have been discussed with the patient and family. After consideration of risks, benefits and other options for treatment, the patient has consented to  Procedure(s): TOTAL KNEE ARTHROPLASTY (Right) as a surgical intervention .  The patient's history has been reviewed, patient examined, no change in status, stable for surgery.  I have reviewed the patient's chart and labs.  Questions were answered to the patient's satisfaction.     [name]

## 2013-04-04 NOTE — Brief Op Note (Signed)
04/04/2013  10:43 AM  PATIENT:  Alexa Carson  67 y.o. female  PRE-OPERATIVE DIAGNOSIS:  RIGHT TOTAL KNEE ARTHROPLASTY  POST-OPERATIVE DIAGNOSIS:  RIGHT TOTAL KNEE ARTHROPLASTY  PROCEDURE:  Procedure(s): TOTAL KNEE ARTHROPLASTY  SURGEON:  Surgeon(s): Cammy Copa, MD  ASSISTANT: S vernon pa  ANESTHESIA:   general  EBL: 100 ml    Total I/O In: 2050 [I.V.:2050] Out: 300 [Urine:200; Blood:100]  BLOOD ADMINISTERED: none  DRAINS: none   LOCAL MEDICATIONS USED:  exparel - mso4/clonidine - skin anesthetic  SPECIMEN:  No Specimen  COUNTS:  YES  TOURNIQUET:   Total Tourniquet Time Documented: Thigh (Right) - 107 minutes Total: Thigh (Right) - 107 minutes   DICTATION: .Other Dictation: Dictation Number [id number]  PLAN OF CARE: Admit to inpatient   PATIENT DISPOSITION:  PACU - hemodynamically stable

## 2013-04-04 NOTE — Progress Notes (Signed)
ANTICOAGULATION CONSULT NOTE - Initial Consult  Pharmacy Consult for Coumadin Indication: VTE prophylaxis s/p right TKA  Allergies  Allergen Reactions  . Augmentin [Amoxicillin-Pot Clavulanate]   . Penicillins   . Sulfa Antibiotics   . Latex Rash    Burn with adhesive    Patient Measurements:   Height ~ 150 cm Weight ~ 135 kg  Vital Signs: Temp: 98.4 F (36.9 C) (11/18 1229) Temp src: Oral (11/18 0618) BP: 135/81 mmHg (11/18 1229) Pulse Rate: 95 (11/18 1229)  Labs: Baseline INR (11/10) 0.96  CBC (11/10) Hgb 13.1, Hct 19, PLTC 420   Medical History: Past Medical History  Diagnosis Date  . Hypertension   . Asthma   . Sleep apnea     cpap  . Pneumonia     child  . History of kidney stones   . Arthritis     Medications:  Prescriptions prior to admission  Medication Sig Dispense Refill  . albuterol (PROVENTIL HFA;VENTOLIN HFA) 108 (90 BASE) MCG/ACT inhaler Inhale into the lungs every 6 (six) hours as needed for wheezing or shortness of breath.      Marland Kitchen alendronate (FOSAMAX) 70 MG tablet Take 70 mg by mouth once a week. Takes on Sundays      . atorvastatin (LIPITOR) 20 MG tablet Take 20 mg by mouth daily.      . celecoxib (CELEBREX) 200 MG capsule Take 200 mg by mouth daily.      . cholecalciferol (VITAMIN D) 1000 UNITS tablet Take 4,000 Units by mouth daily.      Marland Kitchen lisinopril-hydrochlorothiazide (PRINZIDE,ZESTORETIC) 20-12.5 MG per tablet Take 1 tablet by mouth daily.      . montelukast (SINGULAIR) 10 MG tablet Take 10 mg by mouth at bedtime.      . naproxen sodium (ANAPROX) 220 MG tablet Take 440 mg by mouth daily as needed (for pain).      . Fluticasone-Salmeterol (ADVAIR) 500-50 MCG/DOSE AEPB Inhale 1 puff into the lungs as needed.        Assessment: 67 yo F s/p R TKA to start Coumadin for post-op VTE prophylaxis.    Goal of Therapy:  INR 2-3 Monitor platelets by anticoagulation protocol: Yes   Plan:  Coumadin 7.5 mg PO x 1 tonight. Daily INR.   Coumadin education materials ordered.  [employer], Pharm.D., BCPS Clinical Pharmacist Pager [phone] 04/04/2013 2:02 PM

## 2013-04-04 NOTE — Progress Notes (Signed)
Orthopedic Tech Progress Note Patient Details:  Alexa Carson [birth year] [id number] CPM applied to Right LE with appropriate settings. OHF applied to bed. Footsie roll provided.  CPM Right Knee CPM Right Knee: On Right Knee Flexion (Degrees): 60 Right Knee Extension (Degrees): 0   [name] 04/04/2013, 11:40 AM

## 2013-04-04 NOTE — Evaluation (Signed)
Physical Therapy Evaluation Patient Details Name: Alexa Carson MRN: [id number] DOB: 03/01/1946 Today's Date: 04/04/2013 Time: 1191-4782 PT Time Calculation (min): 757 min  PT Assessment / Plan / Recommendation History of Present Illness  s/p RTKA  Clinical Impression  Pt is s/p TKA resulting in the deficits listed below (see PT Problem List).  Pt will benefit from skilled PT to increase their independence and safety with mobility to allow discharge to the venue listed below.      PT Assessment  Patient needs continued PT services    Follow Up Recommendations  Home health PT;Supervision/Assistance - 24 hour    Does the patient have the potential to tolerate intense rehabilitation      Barriers to Discharge        Equipment Recommendations  Rolling walker with 5" wheels;3in1 (PT) (may already have)    Recommendations for Other Services     Frequency 7X/week    Precautions / Restrictions Precautions Precautions: Knee Required Braces or Orthoses: Knee Immobilizer - Right Knee Immobilizer - Right: On when out of bed or walking Restrictions RLE Weight Bearing: Weight bearing as tolerated   Pertinent Vitals/Pain 6/10 R knee with amb; patient repositioned for comfort and optimal knee extension      Mobility  Bed Mobility Bed Mobility: Supine to Sit;Sitting - Scoot to Edge of Bed Supine to Sit: 4: Min guard Sitting - Scoot to [place] of Bed: 4: Min guard Details for Bed Mobility Assistance: Cues for technique; Inefficient movement, but pt was able to get up even while holding a drink in her L hand! Transfers Transfers: Sit to Stand;Stand to Sit Sit to Stand: 4: Min guard Stand to Sit: 4: Min guard Details for Transfer Assistance: verbal cues for safe hand placement   Ambulation/Gait Ambulation/Gait Assistance: 4: Min guard Ambulation Distance (Feet): 10 Feet Assistive device: Rolling walker Ambulation/Gait Assistance Details: Second person for safety; cues for gait  sequence Gait Pattern: Step-to pattern    Exercises Total Joint Exercises Quad Sets: AROM;Right;5 reps Heel Slides: AAROM;Right;Other reps (comment) (2) Straight Leg Raises: AAROM;Right;5 reps   PT Diagnosis: Difficulty walking;Acute pain  PT Problem List: Decreased strength;Decreased range of motion;Decreased activity tolerance;Decreased balance;Decreased knowledge of use of DME;Decreased knowledge of precautions;Pain PT Treatment Interventions: DME instruction;Gait training;Stair training;Functional mobility training;Therapeutic activities;Therapeutic exercise;Patient/family education     PT Goals(Current goals can be found in the care plan section) Acute Rehab PT Goals Patient Stated Goal: to walk PT Goal Formulation: With patient Time For Goal Achievement: 04/11/13 Potential to Achieve Goals: Good  Visit Information  Last PT Received On: 04/04/13 Assistance Needed: +1 History of Present Illness: s/p RTKA       Prior Functioning  Home Living Family/patient expects to be discharged to:: Private residence Living Arrangements: Spouse/significant other Available Help at Discharge: Family;Available 24 hours/day Type of Home: House Home Access: Stairs to enter [employer] of Steps: 3 Entrance Stairs-Rails: Right Home Layout: One level Home Equipment: Walker - 2 wheels Prior Function Level of Independence: Independent Communication Communication: No difficulties    Cognition  Cognition Arousal/Alertness: Awake/alert Behavior During Therapy: WFL for tasks assessed/performed Overall Cognitive Status: Within Functional Limits for tasks assessed    Extremity/Trunk Assessment Upper Extremity Assessment Upper Extremity Assessment: Overall WFL for tasks assessed Lower Extremity Assessment Lower Extremity Assessment: RLE deficits/detail RLE Deficits / Details: Grossly decr AROM and strength, limited by pain postop; Good quad set RLE: Unable to fully assess due to  pain   Balance    End of  Session    GP     [facility] [place], [place] [phone]  04/04/2013, 5:14 PM

## 2013-04-04 NOTE — Progress Notes (Signed)
UR COMPLETED  

## 2013-04-04 NOTE — Anesthesia Postprocedure Evaluation (Signed)
  Anesthesia Post-op Note  [name]  Procedure(s) Performed: Procedure(s): TOTAL KNEE ARTHROPLASTY (Right)  Patient Location: PACU  Anesthesia Type:General  Level of Consciousness: awake and alert   Airway and Oxygen Therapy: Patient Spontanous Breathing  Post-op Pain: moderate  Post-op Assessment: Post-op Vital signs reviewed  Post-op Vital Signs: stable  Complications: No apparent anesthesia complications

## 2013-04-04 NOTE — Plan of Care (Signed)
Problem: Consults Goal: Diagnosis- Total Joint Replacement Primary Total Knee Right     

## 2013-04-04 NOTE — Preoperative (Signed)
Beta Blockers   Reason not to administer Beta Blockers:Not Applicable 

## 2013-04-05 LAB — CBC
HCT: 32.2 % — ABNORMAL LOW (ref 36.0–46.0)
MCV: 90.2 fL (ref 78.0–100.0)
Platelets: 354 10*3/uL (ref 150–400)
RBC: 3.57 MIL/uL — ABNORMAL LOW (ref 3.87–5.11)
RDW: 13 % (ref 11.5–15.5)
WBC: 9.7 10*3/uL (ref 4.0–10.5)

## 2013-04-05 LAB — PROTIME-INR
INR: 0.99 (ref 0.00–1.49)
Prothrombin Time: 12.9 seconds (ref 11.6–15.2)

## 2013-04-05 LAB — BASIC METABOLIC PANEL
BUN: 11 mg/dL (ref 6–23)
CO2: 23 mEq/L (ref 19–32)
Chloride: 101 mEq/L (ref 96–112)
GFR calc Af Amer: 86 mL/min — ABNORMAL LOW (ref >90)
GFR calc non Af Amer: 74 mL/min — ABNORMAL LOW (ref >90)
Potassium: 4.3 mEq/L (ref 3.5–5.1)
Sodium: 136 mEq/L (ref 135–145)

## 2013-04-05 MED ORDER — WARFARIN SODIUM 7.5 MG PO TABS
7.5000 mg | ORAL_TABLET | Freq: Once | ORAL | Status: AC
  Administered 2013-04-05: 7.5 mg via ORAL
  Filled 2013-04-05: qty 1

## 2013-04-05 NOTE — Progress Notes (Signed)
ANTICOAGULATION CONSULT NOTE - Follow Up Consult  Pharmacy Consult for Coumadin Indication: VTE prophylaxis s/p right TKA  Allergies  Allergen Reactions  . Augmentin [Amoxicillin-Pot Clavulanate]   . Penicillins   . Sulfa Antibiotics   . Latex Rash    Burn with adhesive    Patient Measurements:   Heparin Dosing Weight:   Vital Signs: Temp: 98.2 F (36.8 C) (11/19 1409) Temp src: Oral (11/19 1409) BP: 158/83 mmHg (11/19 1409) Pulse Rate: 97 (11/19 1409)  Labs:  Recent Labs  04/05/13 0454  HGB 10.7*  HCT 32.2*  PLT 354  LABPROT 12.9  INR 0.99  CREATININE 0.81    CrCl is unknown because there is no height on file for the current visit.  Assessment: 67 yo F s/p right TKA (04/04/13)  PMH: HTN, Asthma, Sleep apnea, hx kidney stones, L TKA 2012  Anticoagulation: Coumadin for VTE px; baseline INR 0.96; Coumadin pts = 5. INR today 0.99  Infectious Disease: Clinda x 2 doses (PCN allergy) completed. Tmax 100.8. WBC 9.7. Ecoli UTI found on pre-op labs 11/10 - pan sensitive  Cardiovascular: hx HTN, HLD: BP 158/83, HR 97 on home Lisinopril, HCTZ, Lipitor restarted  Endocrinology  Gastrointestinal / Nutrition: Vit D, docusate,  Neurology: Oxycontin, Celebrex  Nephrology: SCr 0.81  Pulmonary: Dulera, singulair  Hematology / Oncology: Pre-op Hgb 13.1, PLTC 420  PTA Medication Issues: Advair   Goal of Therapy:  INR 2-3 Monitor platelets by anticoagulation protocol: Yes   Plan:  Repeat Coumadin 7.5mg  po x 1 today.  Pedrohenrique Mcconville S. Merilynn Finland, PharmD, BCPS Clinical Staff Pharmacist Pager [phone]  Misty Stanley Stillinger 04/05/2013,2:31 PM

## 2013-04-05 NOTE — Progress Notes (Signed)
Physical Therapy Treatment Patient Details Name: Alexa Carson MRN: [id number] DOB: [birth year] Today's Date: 04/05/2013 Time: 6644-0347 PT Time Calculation (min): 43 min  PT Assessment / Plan / Recommendation  History of Present Illness s/p RTKA   PT Comments   Pt very motivated and progressing well with mobility.  Pt demonstrated therex this tx as well as stair training and further distance with ambulation. Pt needed cues to slow down with stair training for safety but otherwise pt has good safety awareness and good family support for d/c home.  Pt will benefit from [facility] PT upon d/c to increase functional independence.   Follow Up Recommendations  Home health PT;Supervision/Assistance - 24 hour     Does the patient have the potential to tolerate intense rehabilitation     Barriers to Discharge        Equipment Recommendations  Rolling walker with 5" wheels;3in1 (PT)    Recommendations for Other Services    Frequency 7X/week   Progress towards PT Goals Progress towards PT goals: Progressing toward goals  Plan Current plan remains appropriate    Precautions / Restrictions Precautions Precautions: Knee Required Braces or Orthoses: Knee Immobilizer - Right Knee Immobilizer - Right: On when out of bed or walking Restrictions Weight Bearing Restrictions: Yes RLE Weight Bearing: Weight bearing as tolerated   Pertinent Vitals/Pain 6/10 R knee pain; nursing administered meds    Mobility  Bed Mobility Bed Mobility: Rolling Left;Left Sidelying to Sit;Sit to Sidelying Right Rolling Left: 5: Supervision;With rail Left Sidelying to Sit: 4: Min guard;With rails;HOB flat Sit to Sidelying Right: 4: Min guard;HOB flat Details for Bed Mobility Assistance: Pt motivated to complete task today without assistance and HOB flat however did use rails; cues for technique Transfers Transfers: Sit to Stand;Stand to Sit Sit to Stand: 4: Min guard Stand to Sit: 4: Min guard Details for Transfer  Assistance: Min guard for safety Ambulation/Gait Ambulation/Gait Assistance: 4: Min guard Ambulation Distance (Feet): 300 Feet Assistive device: Rolling walker Ambulation/Gait Assistance Details: pt did well with step-through pattern and RW with few cues required for safety/technique Gait Pattern: Step-through pattern Stairs: Yes Stairs Assistance: 4: Min assist Stairs Assistance Details (indicate cue type and reason): Pt negotiated 3 small stairs in gym today with cues for technique and safety; educated on how family will assist at home for safety Stair Management Technique: One rail Right Number of Stairs: 3    Exercises General Exercises - Lower Extremity Ankle Circles/Pumps: AROM;Both;10 reps Quad Sets: AROM;Both;10 reps Gluteal Sets: AROM;Both;10 reps Long Arc Quad: AAROM;10 reps;AROM;Right Straight Leg Raises: AAROM;10 reps;Right   PT Diagnosis:    PT Problem List:   PT Treatment Interventions:     PT Goals (current goals can now be found in the care plan section) Acute Rehab PT Goals Patient Stated Goal: to walk PT Goal Formulation: With patient Time For Goal Achievement: 04/11/13 Potential to Achieve Goals: Good  Visit Information  Last PT Received On: 04/05/13 Assistance Needed: +1 History of Present Illness: s/p RTKA    Subjective Data  Patient Stated Goal: to walk   Cognition  Cognition Arousal/Alertness: Awake/alert Behavior During Therapy: WFL for tasks assessed/performed Overall Cognitive Status: Within Functional Limits for tasks assessed    Balance     End of Session PT - End of Session Equipment Utilized During Treatment: Gait belt;Right knee immobilizer Activity Tolerance: Patient tolerated treatment well Patient left: in bed;with call bell/phone within reach Nurse Communication: Mobility status;Patient requests pain meds CPM Right Knee CPM Right  Knee: Off Right Knee Flexion (Degrees): 60 Right Knee Extension (Degrees): 0 Additional Comments:  completed 1 hour   GP     Davi Rotan, SPTA 04/05/2013, 3:04 PM

## 2013-04-05 NOTE — Op Note (Signed)
NAMEZAKYA, HALABI NO.:  [id number]  MEDICAL RECORD NO.:  [id number]  LOCATION:  [id number]                        FACILITY:  [facility]  PHYSICIAN:  Burnard Bunting, M.D.    DATE OF BIRTH:  [birth year]  DATE OF PROCEDURE:  04/04/2013 DATE OF DISCHARGE:                              OPERATIVE REPORT   PREOPERATIVE DIAGNOSIS:  Right knee arthritis.  POSTOPERATIVE DIAGNOSIS:  Right knee arthritis.  PROCEDURE:  Right total knee replacement.  SURGEON:  Burnard Bunting, M.D.  ASSIST:  Wende Neighbors, P.A.  ANESTHESIA:  General endotracheal.  ESTIMATED BLOOD LOSS:  100 mL.  TOURNIQUET TIME:  107 minutes at 325 mmHg.  COMPONENTS UTILIZED:  A [employer] Triathlon knee 3 femur posterior cruciate sacrificing, 3 tibial base plate, 13 poly, 29 3-peg patella.  INDICATIONS:  Lavilla Delamora is a 67 year old patient with end-stage right knee arthritis, who presents for operative management after explanation of risks and benefits without conservative therapy.  PROCEDURE IN DETAIL:  The patient was brought to the operating room where general endotracheal anesthesia was induced.  Preoperative antibiotics were administered.  Time-out was called.  Right leg was prescrubbed with alcohol, Betadine, which was allowed to air dry, prepped with DuraPrep solution, draped in sterile manner.  [name] was used to cover the operative field.  Time out called.  Anterior approach to the knee was made after I elevated and exsanguinated the leg with Esmarch.  Tourniquet was inflated, 325 mmHg, 107 minutes.  Anterior approach of the knee was made.  Skin and subcutaneous tissue were sharply divided.  Median parapatellar approach was made and precise location marked with a #1 Vicryl suture.  Patella was everted.  Lateral patellofemoral ligament was released.  Minimal dissection performed medially and laterally.  These generally even were, although slightly more on the medial side.  Fat pad partially excised  using intramedullary alignment, 9 mm resection made off the proximal tibia.  An 8-mm resection made off the proximal femur.  This allowed a 9-mm spacer to fit to sit nicely.  The chamfer cuts and block cuts were then made. Tibia was keel punched to accept a 50-mm stemmed tibia.  The trial component of the patella was then prepared, resecting 6 mm off on 18 mm patella replacing it with a 9 mm for a total of 20.  With trial components in position, the trial reduction was performed at the 9, 11, and 13 poly spacer.  The 13 poly spacer gave 1-2 degrees of hyperextension and excellent stability varus and valgus stress at 0, 30, and 90 degrees.  This is one of the chosen __________ removed.  Thorough irrigation performed.  Exparel  injected into the capsule, and 3 L of irrigating solution utilized for irrigating purposes.  __________ then cemented into position.  The same stability parameters were maintained. Tourniquet was released.  Bleeding points were encountered and controlled with electrocautery.  __________ using #1 Vicryl suture followed by interrupted, inverted 0 Vicryl suture, 2-0 Vicryl suture and skin staples.  Solution of Marcaine, morphine, clonidine injected into the knee.  Skin edges also anesthetized with 25 mg of 0.25% Marcaine plain.  Velna Hatchet Vernon's assistance  was required at all times during the case for retraction, opening, closing, limb positioning.  Her assistance was a medical necessity.  Excess cement was removed after cementation was performed.  The patient tolerated the procedure well without immediate complications.  Bulky wrap, knee immobilizer placed.     Burnard Bunting, M.D.     GSD/MEDQ  D:  04/04/2013  T:  04/05/2013  Job:  [id number]

## 2013-04-05 NOTE — Evaluation (Signed)
Occupational Therapy Evaluation Patient Details Name: Alexa Carson MRN: [id number] DOB: 1946-04-24 Today's Date: 04/05/2013 Time: 0829-0900 OT Time Calculation (min): 31 min  OT Assessment / Plan / Recommendation History of present illness s/p RTKA   Clinical Impression   Pt presents with below problem list. Pt independent with ADLs, PTA. Pt will benefit from acute OT to increase independence prior to d/c.     OT Assessment  Patient needs continued OT Services    Follow Up Recommendations  No OT follow up;Supervision/Assistance - 24 hour    Barriers to Discharge      Equipment Recommendations  Other (comment) (AE kit)    Recommendations for Other Services    Frequency  Min 2X/week    Precautions / Restrictions Precautions Precautions: Knee;Fall Required Braces or Orthoses: Knee Immobilizer - Right Knee Immobilizer - Right: On when out of bed or walking Restrictions Weight Bearing Restrictions: Yes RLE Weight Bearing: Weight bearing as tolerated   Pertinent Vitals/Pain Pain 3/10. Repositioned.     ADL  Lower Body Dressing: Maximal assistance Where Assessed - Lower Body Dressing: Supported sit to stand Toilet Transfer: Minimal assistance;Min guard Toilet Transfer Method: Sit to stand;Other (comment) (Min A-sit to stand and Min guard-stand to sit) Acupuncturist: Raised toilet seat with arms (or 3-in-1 over toilet) Toileting - Clothing Manipulation and Hygiene: Min guard Where Assessed - Engineer, mining and Hygiene: Standing Tub/Shower Transfer: Simulated;Minimal assistance Tub/Shower Transfer Method: Science writer: Walk in shower Equipment Used: Gait belt;Rolling walker;Sock aid;Reacher;Long-handled sponge;Long-handled shoe horn;Knee Immobilizer Transfers/Ambulation Related to ADLs: Min guard for ambulation and Min A/Min guard for sit <> stand transfers. Min A for shower transfer. ADL Comments: Educated on AE for LB  ADLs. Pt unable to reach down to don either sock. Educated that trying to bend straight down to don/doff right sock is beneficial in that it increases ROM in knee. Educated to sit to bathe and also to stand in front of chair/bed with walker in front when pulling up LB clothing.  Recommended spouse be with her 24/7.  Educated on dressing technique. Pt practiced simulated shower transfer-pt states she plans to sponge bathe for a while, but she wanted to still practice the shower transfer-she is also unsure if walker will fit in shower. Educated to have spouse with her bathing.    OT Diagnosis: Acute pain  OT Problem List: Decreased strength;Decreased range of motion;Decreased activity tolerance;Impaired balance (sitting and/or standing);Decreased knowledge of use of DME or AE;Decreased knowledge of precautions;Pain OT Treatment Interventions: Self-care/ADL training;DME and/or AE instruction;Therapeutic activities;Patient/family education;Balance training   OT Goals(Current goals can be found in the care plan section) Acute Rehab OT Goals Patient Stated Goal: not stated OT Goal Formulation: With patient Time For Goal Achievement: 04/12/13 Potential to Achieve Goals: Good ADL Goals Pt Will Perform Lower Body Bathing: with modified independence;with adaptive equipment;sit to/from stand Pt Will Perform Lower Body Dressing: with modified independence;with adaptive equipment;sit to/from stand Pt Will Transfer to Toilet: with modified independence;ambulating (3 in 1 over commode) Pt Will Perform Toileting - Clothing Manipulation and hygiene: with modified independence;sit to/from stand  Visit Information  Last OT Received On: 04/05/13 Assistance Needed: +1 History of Present Illness: s/p RTKA       Prior Functioning     Home Living Family/patient expects to be discharged to:: Private residence Living Arrangements: Spouse/significant other Available Help at Discharge: Family;Available 24  hours/day Type of Home: House Home Access: Stairs to enter [employer] of Steps: 3 Entrance  Stairs-Rails: Right Home Layout: One level Home Equipment: Walker - 2 wheels Prior Function Level of Independence: Independent Communication Communication: No difficulties         Vision/Perception     Cognition  Cognition Arousal/Alertness: Awake/alert Behavior During Therapy: WFL for tasks assessed/performed Overall Cognitive Status: Within Functional Limits for tasks assessed    Extremity/Trunk Assessment Upper Extremity Assessment Upper Extremity Assessment: Overall WFL for tasks assessed Lower Extremity Assessment Lower Extremity Assessment: Defer to PT evaluation     Mobility Bed Mobility Bed Mobility: Supine to Sit Supine to Sit: 4: Min assist;HOB flat Details for Bed Mobility Assistance: Assist to elevate trunk. Educated that she could hook leg to assist Rt leg.  Transfers Transfers: Sit to Stand;Stand to Sit Sit to Stand: 4: Min assist;With upper extremity assist;From bed;From chair/3-in-1 Stand to Sit: 4: Min guard;To chair/3-in-1 Details for Transfer Assistance: Cues for hand placement.      Exercise     Balance     End of Session CPM Right Knee CPM Right Knee: Off  GO     Earlie Raveling OTR/L [phone] 04/05/2013, 9:22 AM

## 2013-04-05 NOTE — Progress Notes (Signed)
Physical Therapy Treatment Patient Details Name: Alexa Carson MRN: [id number] DOB: Jun 04, 1945 Today's Date: 04/05/2013 Time: 1015-1030 (and 1045 to 11) PT Time Calculation (min): 15 min  PT Assessment / Plan / Recommendation  History of Present Illness s/p RTKA   PT Comments   Making excellent progress with amb distance; very good R Knee stance stability; Will plan to focus more on therex next session; On track for dc home   Follow Up Recommendations  Home health PT;Supervision/Assistance - 24 hour     Does the patient have the potential to tolerate intense rehabilitation     Barriers to Discharge        Equipment Recommendations  Rolling walker with 5" wheels;3in1 (PT) (may already have)    Recommendations for Other Services    Frequency 7X/week   Progress towards PT Goals Progress towards PT goals: Progressing toward goals  Plan Current plan remains appropriate    Precautions / Restrictions Precautions Precautions: Knee Required Braces or Orthoses: Knee Immobilizer - Right Knee Immobilizer - Right: On when out of bed or walking Restrictions Weight Bearing Restrictions: Yes RLE Weight Bearing: Weight bearing as tolerated   Pertinent Vitals/Pain 2/10 pain R knee; patient repositioned for comfort and optimal knee ext    Mobility  Bed Mobility Bed Mobility: Supine to Sit Supine to Sit: 4: Min assist;HOB flat Details for Bed Mobility Assistance: Assist to elevate trunk. Educated that she could hook leg to assist Rt leg.  Transfers Transfers: Sit to Stand;Stand to Sit Sit to Stand: 4: Min guard Stand to Sit: 4: Min guard Details for Transfer Assistance: verbal cues for safe hand placement; good rise without physical assist Ambulation/Gait Ambulation/Gait Assistance: 4: Min guard Ambulation Distance (Feet): 150 Feet Assistive device: Rolling walker Ambulation/Gait Assistance Details: Cues to progress to step-through gait pattern, and to self-monitor for activity  tolerance; Excellent walk Gait Pattern: Step-to pattern;Step-through pattern    Exercises     PT Diagnosis:    PT Problem List:   PT Treatment Interventions:     PT Goals (current goals can now be found in the care plan section) Acute Rehab PT Goals Patient Stated Goal: to walk PT Goal Formulation: With patient Time For Goal Achievement: 04/11/13 Potential to Achieve Goals: Good  Visit Information  Last PT Received On: 04/05/13 Assistance Needed: +1 History of Present Illness: s/p RTKA    Subjective Data  Patient Stated Goal: to walk   Cognition  Cognition Arousal/Alertness: Awake/alert Behavior During Therapy: WFL for tasks assessed/performed Overall Cognitive Status: Within Functional Limits for tasks assessed    Balance     End of Session CPM Right Knee CPM Right Knee: Off   GP     Alexa Carson [place], [place] [phone]  04/05/2013, 12:23 PM

## 2013-04-05 NOTE — Progress Notes (Signed)
Subjective: Pt stable   Objective: Vital signs in last 24 hours: Temp:  [97.9 F (36.6 C)-100.8 F (38.2 C)] 98.8 F (37.1 C) (11/19 0559) Pulse Rate:  [89-111] 111 (11/19 0559) Resp:  [12-20] 16 (11/19 0559) BP: (129-169)/(77-90) 167/89 mmHg (11/19 0559) SpO2:  [92 %-100 %] 99 % (11/19 0559)  Intake/Output from previous day: 11/18 0701 - 11/19 0700 In: 6140 [P.O.:530; I.V.:4060; IV Piggyback:50] Out: 1250 [Urine:1150; Blood:100] Intake/Output this shift:    Exam:  No cellulitis present Compartment soft  Labs:  Recent Labs  04/05/13 0454  HGB 10.7*    Recent Labs  04/05/13 0454  WBC 9.7  RBC 3.57*  HCT 32.2*  PLT 354    Recent Labs  04/05/13 0454  NA 136  K 4.3  CL 101  CO2 23  BUN 11  CREATININE 0.81  GLUCOSE 164*  CALCIUM 9.6    Recent Labs  04/05/13 0454  INR 0.99    Assessment/Plan: Mobilize today - plan for cpm and pt today   DEAN,GREGORY SCOTT 04/05/2013, 7:03 AM

## 2013-04-05 NOTE — Progress Notes (Signed)
Patient not ready to wear cpap at this time. 

## 2013-04-06 LAB — CBC
MCH: 29.9 pg (ref 26.0–34.0)
Platelets: 334 10*3/uL (ref 150–400)
RBC: 3.35 MIL/uL — ABNORMAL LOW (ref 3.87–5.11)
RDW: 12.8 % (ref 11.5–15.5)
WBC: 11.4 10*3/uL — ABNORMAL HIGH (ref 4.0–10.5)

## 2013-04-06 LAB — PROTIME-INR: Prothrombin Time: 14.1 seconds (ref 11.6–15.2)

## 2013-04-06 MED ORDER — METHOCARBAMOL 500 MG PO TABS: 500.0000 mg | ORAL_TABLET | Freq: Three times a day (TID) | ORAL | Status: DC | PRN

## 2013-04-06 MED ORDER — DSS 100 MG PO CAPS: 100.0000 mg | ORAL_CAPSULE | Freq: Two times a day (BID) | ORAL | Status: DC

## 2013-04-06 MED ORDER — OXYCODONE HCL 5 MG PO TABS: 5.0000 mg | ORAL_TABLET | ORAL | Status: DC | PRN

## 2013-04-06 MED ORDER — WARFARIN SODIUM 7.5 MG PO TABS
7.5000 mg | ORAL_TABLET | Freq: Once | ORAL | Status: AC
  Administered 2013-04-06: 7.5 mg via ORAL
  Filled 2013-04-06: qty 1

## 2013-04-06 MED ORDER — WARFARIN SODIUM 5 MG PO TABS: 5.0000 mg | ORAL_TABLET | Freq: Every day | ORAL | Status: DC

## 2013-04-06 NOTE — Progress Notes (Signed)
Cleo Santucci, PTA [phone] 04/06/2013  

## 2013-04-06 NOTE — Progress Notes (Signed)
Subjective: Pt stable - pain controlled   Objective: Vital signs in last 24 hours: Temp:  [98.2 F (36.8 C)-99.7 F (37.6 C)] 98.8 F (37.1 C) (11/20 0700) Pulse Rate:  [96-110] 110 (11/20 0700) Resp:  [18-20] 18 (11/20 0700) BP: (141-158)/(77-92) 158/92 mmHg (11/20 0700) SpO2:  [95 %-97 %] 97 % (11/20 0700) Weight:  [135.399 kg (298 lb 8 oz)] 135.399 kg (298 lb 8 oz) (11/19 1530)  Intake/Output from previous day: 11/19 0701 - 11/20 0700 In: 672 [P.O.:600; I.V.:72] Out: -  Intake/Output this shift:    Exam:  Neurologically intact Neurovascular intact Sensation intact distally Intact pulses distally  Labs:  Recent Labs  04/05/13 0454 04/06/13 0357  HGB 10.7* 10.0*    Recent Labs  04/05/13 0454 04/06/13 0357  WBC 9.7 11.4*  RBC 3.57* 3.35*  HCT 32.2* 30.0*  PLT 354 334    Recent Labs  04/05/13 0454  NA 136  K 4.3  CL 101  CO2 23  BUN 11  CREATININE 0.81  GLUCOSE 164*  CALCIUM 9.6    Recent Labs  04/05/13 0454 04/06/13 0357  INR 0.99 1.11    Assessment/Plan: Plan dc am - doing well   DEAN,GREGORY SCOTT 04/06/2013, 7:56 AM

## 2013-04-06 NOTE — Progress Notes (Signed)
Physical Therapy Treatment Patient Details Name: Alexa Carson MRN: [id number] DOB: [birth year] Today's Date: 04/06/2013 Time: 0930-1008 PT Time Calculation (min): 38 min  PT Assessment / Plan / Recommendation  History of Present Illness s/p RTKA   PT Comments   Pt motivated for PT and did well today.  Pt performed functional bathroom activities with safe techniques.  Pt's mobility continuing to improve.  Recommended to attempt stair training once more prior to d/c.  Pt will benefit from continued [facility] PT once d/c'd home to improve functional independence.   Follow Up Recommendations  Home health PT;Supervision/Assistance - 24 hour     Does the patient have the potential to tolerate intense rehabilitation     Barriers to Discharge        Equipment Recommendations  Rolling walker with 5" wheels;3in1 (PT)    Recommendations for Other Services    Frequency 7X/week   Progress towards PT Goals Progress towards PT goals: Progressing toward goals  Plan Current plan remains appropriate    Precautions / Restrictions Precautions Precautions: Knee Required Braces or Orthoses: Knee Immobilizer - Right Knee Immobilizer - Right: On when out of bed or walking Restrictions Weight Bearing Restrictions: Yes RLE Weight Bearing: Weight bearing as tolerated   Pertinent Vitals/Pain No pain when in bed; 9/10 pain when first getting up however decreased once ambulating. Nursing aware.    Mobility  Bed Mobility Bed Mobility: Supine to Sit;Sitting - Scoot to Edge of Bed Supine to Sit: 4: Min assist;HOB elevated Sitting - Scoot to [place] of Bed: 4: Min guard Details for Bed Mobility Assistance: Pt required assistance to bring trunk forward; stated that she would always have family at home to assist Transfers Transfers: Sit to Stand;Stand to Sit Sit to Stand: 4: Min guard;With upper extremity assist;From bed;From chair/3-in-1 Stand to Sit: 4: Min guard;With upper extremity assist;To chair/3-in-1 Details  for Transfer Assistance: Min guard for safety Ambulation/Gait Ambulation/Gait Assistance: 4: Min guard;5: Supervision Ambulation Distance (Feet): 300 Feet Assistive device: Rolling walker Ambulation/Gait Assistance Details: Progressing to supervision for safety; pt has good safety awareness with RW  Gait Pattern: Step-through pattern Gait velocity: increased from previous tx Stairs: No    Exercises General Exercises - Lower Extremity Ankle Circles/Pumps: AROM;Both;10 reps Long Arc Quad: 10 reps;AROM;Right Heel Slides: AROM;Seated;Right;10 reps Hip ABduction/ADduction: AROM;Right;10 reps Straight Leg Raises: AAROM;10 reps;Right   PT Diagnosis:    PT Problem List:   PT Treatment Interventions:     PT Goals (current goals can now be found in the care plan section)    Visit Information  Last PT Received On: 04/06/13 Assistance Needed: +1 History of Present Illness: s/p RTKA    Subjective Data      Cognition  Cognition Arousal/Alertness: Awake/alert Behavior During Therapy: WFL for tasks assessed/performed Overall Cognitive Status: Within Functional Limits for tasks assessed    Balance     End of Session PT - End of Session Equipment Utilized During Treatment: Gait belt;Right knee immobilizer Activity Tolerance: Patient tolerated treatment well Patient left: in chair;with call bell/phone within reach Nurse Communication: Mobility status;Patient requests pain meds   GP     Ernestina Columbia, SPTA 04/06/2013, 11:18 AM

## 2013-04-06 NOTE — Progress Notes (Signed)
OT Cancellation Note  Patient Details Name: Alexa Carson MRN: [id number] DOB: 09/15/1945   Cancelled Treatment:    Reason Eval/Treat Not Completed: Other (comment) (Pt in CPM c/o fatigue). Attempted to see pt for scheduled OT tx session. Pt in CPM stating she was tired from morning activity. Pt declined OOB activity. Verbally reviewed DME use LB ADLs. Pt verbalized understanding. Tx withheld per pt request.    [name], [place]re Ala 04/06/2013, 2:03 PM

## 2013-04-06 NOTE — Progress Notes (Signed)
ANTICOAGULATION CONSULT NOTE - Follow Up Consult  Pharmacy Consult for Coumadin Indication: VTE prophylaxis s/p right TKA  Allergies  Allergen Reactions  . Augmentin [Amoxicillin-Pot Clavulanate]   . Penicillins   . Sulfa Antibiotics   . Latex Rash    Burn with adhesive    Patient Measurements: Height: 4\' 11"  (149.9 cm) Weight: 298 lb 8 oz (135.399 kg) IBW/kg (Calculated) : 43.2 Heparin Dosing Weight:   Vital Signs: Temp: 99 F (37.2 C) (11/20 1100) Temp src: Oral (11/20 1100) BP: 122/74 mmHg (11/20 1048) Pulse Rate: 111 (11/20 1100)  Labs:  Recent Labs  04/05/13 0454 04/06/13 0357  HGB 10.7* 10.0*  HCT 32.2* 30.0*  PLT 354 334  LABPROT 12.9 14.1  INR 0.99 1.11  CREATININE 0.81  --     Estimated Creatinine Clearance: 86.4 ml/min (by C-G formula based on Cr of 0.81).  Assessment: 67 yo F s/p right TKA (04/04/13)  PMH: HTN, Asthma, Sleep apnea, hx kidney stones, L TKA 2012  Anticoagulation: Coumadin for VTE prophylaxis; baseline INR 0.96; Coumadin predictor points = 5. INR today 1.11 PLTC stable; Hgb 10 down from preop 13.1; no bleeding reported.   Infectious Disease:  Tmax 99.7, WBC 11.4. Ecoli UTI found on pre-op labs 11/10 - pan sensitive; Clinda x 2 doses (PCN allergy) completed.   Cardiovascular: hx HTN, HLD: BP 122/74, HR 111 on home Lisinopril, HCTZ, Lipitor restarted  Endocrinology: serum glucose 164  Gastrointestinal / Nutrition: Vit D, docusate;  obesity  Neurology: Oxycontin, Celebrex; A&O x4  Nephrology: SCr 0.81on 11/19  Pulmonary: Dulera, singulair; RA  Hematology / Oncology: Hgb 10 <Pre-op Hgb 13.1, PLTC 334 stable  PTA Medication Issues: Advair   Goal of Therapy:  INR 2-3 Monitor platelets by anticoagulation protocol: Yes   Plan:  Repeat Coumadin 7.5mg  po x 1 today.   Alexa Carson, RPh Clinical Pharmacist Pager: [phone] 04/06/2013,1:41 PM

## 2013-04-06 NOTE — Progress Notes (Signed)
Patient refused CPAP tonight. There Is a machine in the room at this time. RN aware. Explained to Patient that if they changed their mind, to just have the RN call Respiratory and we would come set them up. 

## 2013-04-07 ENCOUNTER — Encounter (HOSPITAL_COMMUNITY): Payer: Self-pay | Admitting: Orthopedic Surgery

## 2013-04-07 LAB — CBC
HCT: 30 % — ABNORMAL LOW (ref 36.0–46.0)
Hemoglobin: 10.2 g/dL — ABNORMAL LOW (ref 12.0–15.0)
MCH: 30.4 pg (ref 26.0–34.0)
MCHC: 34 g/dL (ref 30.0–36.0)
MCV: 89.3 fL (ref 78.0–100.0)
Platelets: 375 10*3/uL (ref 150–400)
RBC: 3.36 MIL/uL — ABNORMAL LOW (ref 3.87–5.11)
RDW: 13.1 % (ref 11.5–15.5)
WBC: 10.6 10*3/uL — ABNORMAL HIGH (ref 4.0–10.5)

## 2013-04-07 LAB — PROTIME-INR: Prothrombin Time: 13.8 seconds (ref 11.6–15.2)

## 2013-04-07 MED ORDER — WARFARIN SODIUM 5 MG PO TABS
5.0000 mg | ORAL_TABLET | Freq: Once | ORAL | Status: AC
  Administered 2013-04-07: 5 mg via ORAL
  Filled 2013-04-07: qty 1

## 2013-04-07 NOTE — Progress Notes (Signed)
Physical Therapy Treatment Patient Details Name: Alexa Carson MRN: [id number] DOB: Jul 02, 1945 Today's Date: 04/07/2013 Time: 1191-4782 PT Time Calculation (min): 26 min  PT Assessment / Plan / Recommendation  History of Present Illness s/p RTKA   PT Comments   Pt motivated to participate today and progressed with ambulation and stair training. Pt able to perform bathroom functional activities and mobility exercises with good safety awareness.  Pt will benefit with HHPT to increase functional independence and strengthening at home upon d/c.  Pt is safe from a mobility standpoint to go home when MD feels medically ready.  Follow Up Recommendations        Does the patient have the potential to tolerate intense rehabilitation     Barriers to Discharge        Equipment Recommendations       Recommendations for Other Services    Frequency     Progress towards PT Goals Progress towards PT goals: Progressing toward goals  Plan      Precautions / Restrictions Precautions Precautions: Knee Required Braces or Orthoses: Knee Immobilizer - Right Knee Immobilizer - Right: On when out of bed or walking Restrictions Weight Bearing Restrictions: Yes RLE Weight Bearing: Weight bearing as tolerated   Pertinent Vitals/Pain Pt stated "I feel good today, no pain!"    Mobility  Bed Mobility Bed Mobility: Not assessed Details for Bed Mobility Assistance: Pt received in bathroom with nurse tech; wanted to return to recliner post tx. Pt stated that she feels confident with doing bed mobility at home Transfers Transfers: Sit to Stand;Stand to Sit Sit to Stand: From chair/3-in-1;6: Modified independent (Device/Increase time) Stand to Sit: To chair/3-in-1;6: Modified independent (Device/Increase time) Details for Transfer Assistance: Pt demonstrated safe technique with transfers Ambulation/Gait Ambulation/Gait Assistance: 5: Supervision Ambulation Distance (Feet): 600 Feet Assistive device:  Rolling walker Ambulation/Gait Assistance Details: Pt has good safety awarness with RW, vc's to keep all legs of RW on ground with ambulation instead of picking up Gait Pattern: Step-through pattern Gait velocity: increased from previous tx Stairs: Yes Stairs Assistance Details (indicate cue type and reason): VC's for safe technique and how family will assist at home.  Stair Management Technique: One rail Right Number of Stairs: 5    Exercises Total Joint Exercises Ankle Circles/Pumps: AROM;10 reps;Both Quad Sets: AROM;Right;10 reps Gluteal Sets: AROM;10 reps;Both Hip ABduction/ADduction: AROM;Right;10 reps Straight Leg Raises: AAROM;Right;10 reps Long Arc Quad: AROM;Right;10 reps   PT Diagnosis:    PT Problem List:   PT Treatment Interventions:     PT Goals (current goals can now be found in the care plan section)    Visit Information  Last PT Received On: 04/07/13 Assistance Needed: +1 History of Present Illness: s/p RTKA    Subjective Data      Cognition  Cognition Arousal/Alertness: Awake/alert Behavior During Therapy: WFL for tasks assessed/performed Overall Cognitive Status: Within Functional Limits for tasks assessed    Balance     End of Session PT - End of Session Equipment Utilized During Treatment: Gait belt Activity Tolerance: Patient tolerated treatment well Patient left: in chair;with call bell/phone within reach Nurse Communication: Mobility status   GP     Ernestina Columbia, SPTA 04/07/2013, 12:13 PM

## 2013-04-07 NOTE — Progress Notes (Signed)
Attempted to see pt again this morning.  Pt has been up since 4am.  Pt reports bathing self, dressing self, toileting and walking Ily in the hallway. Pt deferred OT as she is in CPM machine.  Seems pt will not need further OT after d/c. Tory Emerald, [place] [phone]

## 2013-04-07 NOTE — Progress Notes (Signed)
04/07/13 Set up with HHPT and HHRN for coumadin monitoring with Gordy Clement  by MD office.Spoke with patent, no change in d/c plan. [employer] providing CPM. Patient states that she has a rolling walker and 3N1 at home. No other  d/c needs identified. Jacquelynn Cree RN, BSN, CCM

## 2013-04-07 NOTE — Progress Notes (Signed)
ANTICOAGULATION CONSULT NOTE - Follow Up Consult  Pharmacy Consult for Coumadin Indication: VTE prophylaxis s/p right TKA  Patient Measurements: Height: 4\' 11"  (149.9 cm) Weight: 298 lb 8 oz (135.399 kg) IBW/kg (Calculated) : 43.2  Vital Signs: Temp: 98.3 F (36.8 C) (11/21 1300) Temp src: Oral (11/21 1300) BP: 137/64 mmHg (11/21 1300) Pulse Rate: 110 (11/21 1300)  Labs:  Recent Labs  04/05/13 0454 04/06/13 0357 04/07/13 0556  HGB 10.7* 10.0* 10.2*  HCT 32.2* 30.0* 30.0*  PLT 354 334 375  LABPROT 12.9 14.1 13.8  INR 0.99 1.11 1.08  CREATININE 0.81  --   --     Estimated Creatinine Clearance: 86.4 ml/min (by C-G formula based on Cr of 0.81).  Assessment:    INR still at baseline after Coumadin 7.5 mg daily x 3 doses.   Discharge planned for today, to continue Coumadin with 5 mg daily;  Outpatient PT/INR checks per Genevieve Norlander.  Discussed Coumadin use, monitoring, precautions and potential drug-drug and drug-food interactions with patient this morning.  She is familiar with Coumadin from use with prior knee surgery. No problems during that short course.  Goal of Therapy:  INR 2-3 Monitor platelets by anticoagulation protocol: Yes   Plan:   Coumadin 5 mg today as planned.    Will given dose prior to discharge.  Outpatient PT/INRs noted arranged.  Dennie Fetters, [place] Pager: [phone] 04/07/2013,3:09 PM

## 2013-04-10 NOTE — Progress Notes (Signed)
Alexa Carson, [place] [phone] 04/10/2013

## 2013-04-10 NOTE — Progress Notes (Signed)
Seen and agreed 04/10/2013 Fredrich Birks PTA [phone] pager [phone] office

## 2013-04-19 NOTE — Discharge Summary (Signed)
Physician Discharge Summary  Patient ID: Alexa Carson MRN: [id number] DOB/AGE: [birth year] 67 y.o.  Admit date: 04/04/2013 Discharge date: 04/07/2013 Admission Diagnoses:  Knee arthritis  Discharge Diagnoses:  Same  Surgeries: Procedure(s): TOTAL KNEE ARTHROPLASTY on 04/04/2013   Consultants:    Discharged Condition: Stable  Hospital Course: Alexa Carson is an 67 y.o. female who was admitted 04/04/2013 with a chief complaint of knee arthritis, and found to have a diagnosis of knee arthritis.  They were brought to the operating room on 04/04/2013 and underwent the above named procedures.Tolerated the procedure well and was dced home POD 3 in good condition.    Antibiotics given:  Anti-infectives   Start     Dose/Rate Route Frequency Ordered Stop   04/04/13 1400  clindamycin (CLEOCIN) IVPB 600 mg     600 mg 100 mL/hr over 30 Minutes Intravenous Every 6 hours 04/04/13 1300 04/04/13 2025   04/04/13 0600  clindamycin (CLEOCIN) IVPB 900 mg     900 mg 100 mL/hr over 30 Minutes Intravenous 30 min pre-op 04/03/13 1624 04/04/13 0740    .  Recent vital signs:  Filed Vitals:   04/07/13 1600  BP:   Pulse:   Temp:   Resp: 18    Recent laboratory studies:  Results for orders placed during the hospital encounter of 04/04/13  PROTIME-INR      Result Value Range   Prothrombin Time 12.9  11.6 - 15.2 seconds   INR 0.99  0.00 - 1.49  CBC      Result Value Range   WBC 9.7  4.0 - 10.5 K/uL   RBC 3.57 (*) 3.87 - 5.11 MIL/uL   Hemoglobin 10.7 (*) 12.0 - 15.0 g/dL   HCT 40.9 (*) 81.1 - 91.4 %   MCV 90.2  78.0 - 100.0 fL   MCH 30.0  26.0 - 34.0 pg   MCHC 33.2  30.0 - 36.0 g/dL   RDW 78.2  95.6 - 21.3 %   Platelets 354  150 - 400 K/uL  BASIC METABOLIC PANEL      Result Value Range   Sodium 136  135 - 145 mEq/L   Potassium 4.3  3.5 - 5.1 mEq/L   Chloride 101  96 - 112 mEq/L   CO2 23  19 - 32 mEq/L   Glucose, Bld 164 (*) 70 - 99 mg/dL   BUN 11  6 - 23 mg/dL   Creatinine, Ser 0.86   0.50 - 1.10 mg/dL   Calcium 9.6  8.4 - 57.8 mg/dL   GFR calc non Af Amer 74 (*) >90 mL/min   GFR calc Af Amer 86 (*) >90 mL/min  PROTIME-INR      Result Value Range   Prothrombin Time 14.1  11.6 - 15.2 seconds   INR 1.11  0.00 - 1.49  CBC      Result Value Range   WBC 11.4 (*) 4.0 - 10.5 K/uL   RBC 3.35 (*) 3.87 - 5.11 MIL/uL   Hemoglobin 10.0 (*) 12.0 - 15.0 g/dL   HCT 46.9 (*) 62.9 - 52.8 %   MCV 89.6  78.0 - 100.0 fL   MCH 29.9  26.0 - 34.0 pg   MCHC 33.3  30.0 - 36.0 g/dL   RDW 41.3  24.4 - 01.0 %   Platelets 334  150 - 400 K/uL  PROTIME-INR      Result Value Range   Prothrombin Time 13.8  11.6 - 15.2 seconds   INR 1.08  0.00 -  1.49  CBC      Result Value Range   WBC 10.6 (*) 4.0 - 10.5 K/uL   RBC 3.36 (*) 3.87 - 5.11 MIL/uL   Hemoglobin 10.2 (*) 12.0 - 15.0 g/dL   HCT 16.1 (*) 09.6 - 04.5 %   MCV 89.3  78.0 - 100.0 fL   MCH 30.4  26.0 - 34.0 pg   MCHC 34.0  30.0 - 36.0 g/dL   RDW 40.9  81.1 - 91.4 %   Platelets 375  150 - 400 K/uL    Discharge Medications:     Medication List    STOP taking these medications       naproxen sodium 220 MG tablet  Commonly known as:  ANAPROX      TAKE these medications       albuterol 108 (90 BASE) MCG/ACT inhaler  Commonly known as:  PROVENTIL HFA;VENTOLIN HFA  Inhale into the lungs every 6 (six) hours as needed for wheezing or shortness of breath.     alendronate 70 MG tablet  Commonly known as:  FOSAMAX  Take 70 mg by mouth once a week. Takes on Sundays     atorvastatin 20 MG tablet  Commonly known as:  LIPITOR  Take 20 mg by mouth daily.     celecoxib 200 MG capsule  Commonly known as:  CELEBREX  Take 200 mg by mouth daily.     cholecalciferol 1000 UNITS tablet  Commonly known as:  VITAMIN D  Take 4,000 Units by mouth daily.     DSS 100 MG Caps  Take 100 mg by mouth 2 (two) times daily.     Fluticasone-Salmeterol 500-50 MCG/DOSE Aepb  Commonly known as:  ADVAIR  Inhale 1 puff into the lungs as needed.      lisinopril-hydrochlorothiazide 20-12.5 MG per tablet  Commonly known as:  PRINZIDE,ZESTORETIC  Take 1 tablet by mouth daily.     methocarbamol 500 MG tablet  Commonly known as:  ROBAXIN  Take 1 tablet (500 mg total) by mouth every 8 (eight) hours as needed for muscle spasms.     montelukast 10 MG tablet  Commonly known as:  SINGULAIR  Take 10 mg by mouth at bedtime.     oxyCODONE 5 MG immediate release tablet  Commonly known as:  Oxy IR/ROXICODONE  Take 1-2 tablets (5-10 mg total) by mouth every 3 (three) hours as needed for breakthrough pain.     warfarin 5 MG tablet  Commonly known as:  COUMADIN  Take 1 tablet (5 mg total) by mouth daily.        Diagnostic Studies: Dg Chest 2 View  03/27/2013   CLINICAL DATA:  Shortness of breath.  EXAM: CHEST  2 VIEW  COMPARISON:  None.  FINDINGS: The heart size and mediastinal contours are within normal limits. Both lungs are clear. The visualized skeletal structures are unremarkable except for degenerative changes of the thoracic spine.  IMPRESSION: No active cardiopulmonary disease.   Electronically Signed   By: Maisie Fus  Register   On: 03/27/2013 12:28    Disposition: 06-Home-Health Care Svc      Discharge Orders   Future Orders Complete By Expires   Call MD / Call 911  As directed    Comments:     If you experience chest pain or shortness of breath, CALL 911 and be transported to the hospital emergency room.  If you develope a fever above 101 F, pus (white drainage) or increased drainage or redness at the  wound, or calf pain, call your surgeon's office.   Constipation Prevention  As directed    Comments:     Drink plenty of fluids.  Prune juice may be helpful.  You may use a stool softener, such as Colace (over the counter) 100 mg twice a day.  Use MiraLax (over the counter) for constipation as needed.   Diet - low sodium heart healthy  As directed    Discharge instructions  As directed    Comments:     Weight bearing as tolerated  with crutches CPM 4 - 6 hours per day Keep incision dry   Increase activity slowly as tolerated  As directed          Signed: [name] 04/19/2013, 8:21 AM

## 2013-05-08 ENCOUNTER — Ambulatory Visit: Payer: Medicare Other | Attending: Orthopedic Surgery | Admitting: Physical Therapy

## 2013-05-08 DIAGNOSIS — M25669 Stiffness of unspecified knee, not elsewhere classified: Secondary | ICD-10-CM | POA: Insufficient documentation

## 2013-05-08 DIAGNOSIS — M6281 Muscle weakness (generalized): Secondary | ICD-10-CM | POA: Insufficient documentation

## 2013-05-08 DIAGNOSIS — IMO0001 Reserved for inherently not codable concepts without codable children: Secondary | ICD-10-CM | POA: Insufficient documentation

## 2013-05-08 DIAGNOSIS — R262 Difficulty in walking, not elsewhere classified: Secondary | ICD-10-CM | POA: Insufficient documentation

## 2013-05-08 DIAGNOSIS — M25569 Pain in unspecified knee: Secondary | ICD-10-CM | POA: Insufficient documentation

## 2013-05-10 ENCOUNTER — Ambulatory Visit: Payer: Medicare Other | Admitting: Physical Therapy

## 2013-05-16 ENCOUNTER — Ambulatory Visit: Payer: Medicare Other | Admitting: Physical Therapy

## 2013-05-17 ENCOUNTER — Ambulatory Visit: Payer: Medicare Other | Admitting: Physical Therapy

## 2013-05-23 ENCOUNTER — Ambulatory Visit: Payer: Medicare Other | Admitting: Physical Therapy

## 2013-05-25 ENCOUNTER — Ambulatory Visit: Payer: Medicare Other | Attending: Orthopedic Surgery | Admitting: Physical Therapy

## 2013-05-25 DIAGNOSIS — IMO0001 Reserved for inherently not codable concepts without codable children: Secondary | ICD-10-CM | POA: Insufficient documentation

## 2013-05-25 DIAGNOSIS — M6281 Muscle weakness (generalized): Secondary | ICD-10-CM | POA: Insufficient documentation

## 2013-05-25 DIAGNOSIS — M25669 Stiffness of unspecified knee, not elsewhere classified: Secondary | ICD-10-CM | POA: Insufficient documentation

## 2013-05-25 DIAGNOSIS — R262 Difficulty in walking, not elsewhere classified: Secondary | ICD-10-CM | POA: Insufficient documentation

## 2013-05-25 DIAGNOSIS — M25569 Pain in unspecified knee: Secondary | ICD-10-CM | POA: Insufficient documentation

## 2013-05-30 ENCOUNTER — Ambulatory Visit: Payer: Medicare Other | Admitting: Physical Therapy

## 2013-06-01 ENCOUNTER — Ambulatory Visit: Payer: Medicare Other | Admitting: Physical Therapy

## 2013-06-06 ENCOUNTER — Ambulatory Visit: Payer: Medicare Other | Admitting: Physical Therapy

## 2013-06-08 ENCOUNTER — Ambulatory Visit: Payer: Medicare Other | Admitting: Physical Therapy

## 2013-06-13 ENCOUNTER — Ambulatory Visit: Payer: Medicare Other | Admitting: Physical Therapy

## 2013-06-15 ENCOUNTER — Ambulatory Visit: Payer: Medicare Other | Admitting: Physical Therapy

## 2013-07-24 ENCOUNTER — Other Ambulatory Visit (HOSPITAL_COMMUNITY): Payer: Self-pay | Admitting: Orthopedic Surgery

## 2013-08-07 ENCOUNTER — Encounter (HOSPITAL_COMMUNITY): Payer: Self-pay | Admitting: Pharmacy Technician

## 2013-08-11 ENCOUNTER — Encounter (HOSPITAL_COMMUNITY)
Admission: RE | Admit: 2013-08-11 | Discharge: 2013-08-11 | Disposition: A | Discharge status: Home / Self Care | Payer: Medicare Other | Source: Ambulatory Visit | Attending: Orthopedic Surgery | Admitting: Orthopedic Surgery

## 2013-08-11 ENCOUNTER — Encounter (HOSPITAL_COMMUNITY): Payer: Self-pay

## 2013-08-11 DIAGNOSIS — Z01812 Encounter for preprocedural laboratory examination: Secondary | ICD-10-CM | POA: Insufficient documentation

## 2013-08-11 LAB — CBC
HCT: 37.2 % (ref 36.0–46.0)
Hemoglobin: 12.5 g/dL (ref 12.0–15.0)
MCH: 29.9 pg (ref 26.0–34.0)
MCHC: 33.6 g/dL (ref 30.0–36.0)
MCV: 89 fL (ref 78.0–100.0)
Platelets: 421 K/uL — ABNORMAL HIGH (ref 150–400)
RBC: 4.18 MIL/uL (ref 3.87–5.11)
RDW: 12.9 % (ref 11.5–15.5)
WBC: 7.4 K/uL (ref 4.0–10.5)

## 2013-08-11 LAB — BASIC METABOLIC PANEL WITH GFR
BUN: 10 mg/dL (ref 6–23)
CO2: 23 meq/L (ref 19–32)
Calcium: 9.8 mg/dL (ref 8.4–10.5)
Chloride: 101 meq/L (ref 96–112)
Creatinine, Ser: 0.7 mg/dL (ref 0.50–1.10)
GFR calc Af Amer: >90 mL/min
GFR calc non Af Amer: 88 mL/min — ABNORMAL LOW
Glucose, Bld: 227 mg/dL — ABNORMAL HIGH (ref 70–99)
Potassium: 4.3 meq/L (ref 3.7–5.3)
Sodium: 138 meq/L (ref 137–147)

## 2013-08-11 NOTE — Progress Notes (Addendum)
PCP:Dr. Zollie Peeon Bulla at [facility],[address] , [place], [place]  Pulmonologist: Dr. Bethanie DickerWayne Beauford at [facility] in [place]  Requested sleep study and last notes.  Denies echo/stress test/heart cath.

## 2013-08-11 NOTE — Pre-Procedure Instructions (Signed)
Eppie GibsonMary Cragin  08/11/2013   Your procedure is scheduled on:  Tuesday, April 7  [facility]" at 7:20 AM.  Call this number if you have problems the morning of surgery: [phone]   Remember:   Do not eat food or drink liquids after midnight.   Take these medicines the morning of surgery with A SIP OF WATER: albuterol if needed(bring to hospital),  advir if needed (bring to hospital), robaxin,    Do not wear jewelry, make-up or nail polish.  Do not wear lotions, powders, or perfumes. You may wear deodorant.  Do not shave 48 hours prior to surgery. Men may shave face and neck.  Do not bring valuables to the hospital.  [facility] is not responsible  for any belongings or valuables.               Contacts, dentures or bridgework may not be worn into surgery.  Leave suitcase in the car. After surgery it may be brought to your room.  For patients admitted to the hospital, discharge time is determined by your                treatment team.               Patients discharged the day of surgery will not be allowed to drive  home.  Name and phone number of your driver:    Special Instructions: Review Preparing for Surgery Handout   Please read over the following fact sheets that you were given: Pain Booklet, Coughing and Deep Breathing and Surgical Site Infection Prevention

## 2013-08-14 NOTE — Progress Notes (Addendum)
Anesthesia chart review: Patient is a 68 year old female scheduled for left shoulder arthroscopy, debridement, possible rotator cuff tear debridement versus repair on 08/22/13 by Alexa Carson. History includes non-smoker, HTN, asthma, OSA with CPAP use (Dr. Bethanie DickerWayne Carson), arthritis, nephrolithiasis, hysterectomy, cholecystectomy, left TKA '12, right TKA '14. BMI is recorded as 59.6 consistent with morbid obesity.  PCP Alexa Peeon Bulla, PA-C with [facility].  She denied cardiac testing at PAT.   EKG on 03/27/13 showed NSR, low voltage QRS.  CXR on 03/27/13 showed no active cardiopulmonary disease.  Preoperative labs noted.  Glucose was documented as 227. She reported that she had eaten that morning.  She reported a complete physical ~ 06/2013 with fasting labs and was told her glucose was okay. She has never even been told that she was a borderline diabetic. Her husband is a diabetic so, she will see if he can check her fasting glucose. She will also watch her sugar intake.  She asked that I contact Alexa Carson's office to let her know if she needed to be seen again prior to surgery. I left a voice message with Alexa Carson's medical assistant regarding patient's non-fasting glucose results and asked that they follow-up with patient regarding her home glucose results to determine any preoperatively recommendations.  Patient was told that she would get a CBG checked on arrival to ensure her fasting glucose was < 200.  I have also updated Alexa Carson at Alexa Carson's office.   Alexa Ochsllison Katlynne Mckercher, PA-C [facility]Anesthesiology Phone [phone] 08/14/2013 4:57 PM  Addendum: 08/18/2013 1:56 PM Received [facility]. I also called patient to follow-up. Fasting glucose on 06/08/13 was 136. A1C on 12/22/12 was 6.3. She had normal spirometry test on 07/27/13. Echo was > 3 years ago.  Patient reports she is actually suppose to Bulla, PA-C for an appointment on 08/21/13 re: hyperglycemia.  She  has been walking 1 mile/day. She has had a home fasting CBG of 126 and 130 the past two mornings. With these readings I think she will be okay to proceed.  I did instruct her that if Bulla, PA-C starts her on any DM medications that she should not take them on the morning of surgery.  She has my work number if needed, but I told her that Alexa Carson could contact Alexa Carson's office if he had any concerns or specific pre-operative recommendations.

## 2013-08-21 MED ORDER — CLINDAMYCIN PHOSPHATE 900 MG/50ML IV SOLN
900.0000 mg | INTRAVENOUS | Status: AC
  Administered 2013-08-22: 900 mg via INTRAVENOUS
  Filled 2013-08-21: qty 50

## 2013-08-21 NOTE — Progress Notes (Signed)
Patient notified to arrive at 0645 in the morning for surgery.

## 2013-08-22 ENCOUNTER — Ambulatory Visit (HOSPITAL_COMMUNITY)
Admission: RE | Admit: 2013-08-22 | Discharge: 2013-08-22 | Disposition: A | Discharge status: Home / Self Care | Payer: Medicare Other | Source: Ambulatory Visit | Attending: Orthopedic Surgery | Admitting: Orthopedic Surgery

## 2013-08-22 ENCOUNTER — Encounter (HOSPITAL_COMMUNITY): Payer: Medicare Other | Admitting: Vascular Surgery

## 2013-08-22 ENCOUNTER — Ambulatory Visit (HOSPITAL_COMMUNITY): Payer: Medicare Other | Admitting: Anesthesiology

## 2013-08-22 ENCOUNTER — Encounter (HOSPITAL_COMMUNITY)
Admission: RE | Disposition: A | Discharge status: Home / Self Care | Payer: Self-pay | Source: Ambulatory Visit | Attending: Orthopedic Surgery

## 2013-08-22 ENCOUNTER — Encounter (HOSPITAL_COMMUNITY): Payer: Self-pay | Admitting: Anesthesiology

## 2013-08-22 DIAGNOSIS — M75120 Complete rotator cuff tear or rupture of unspecified shoulder, not specified as traumatic: Secondary | ICD-10-CM | POA: Insufficient documentation

## 2013-08-22 DIAGNOSIS — M19019 Primary osteoarthritis, unspecified shoulder: Secondary | ICD-10-CM | POA: Insufficient documentation

## 2013-08-22 DIAGNOSIS — I10 Essential (primary) hypertension: Secondary | ICD-10-CM | POA: Insufficient documentation

## 2013-08-22 DIAGNOSIS — M67919 Unspecified disorder of synovium and tendon, unspecified shoulder: Secondary | ICD-10-CM | POA: Insufficient documentation

## 2013-08-22 DIAGNOSIS — Z5333 Arthroscopic surgical procedure converted to open procedure: Secondary | ICD-10-CM | POA: Insufficient documentation

## 2013-08-22 DIAGNOSIS — Z96659 Presence of unspecified artificial knee joint: Secondary | ICD-10-CM | POA: Insufficient documentation

## 2013-08-22 DIAGNOSIS — Z79899 Other long term (current) drug therapy: Secondary | ICD-10-CM | POA: Insufficient documentation

## 2013-08-22 DIAGNOSIS — M719 Bursopathy, unspecified: Secondary | ICD-10-CM | POA: Insufficient documentation

## 2013-08-22 DIAGNOSIS — G4730 Sleep apnea, unspecified: Secondary | ICD-10-CM | POA: Insufficient documentation

## 2013-08-22 DIAGNOSIS — M75100 Unspecified rotator cuff tear or rupture of unspecified shoulder, not specified as traumatic: Secondary | ICD-10-CM

## 2013-08-22 HISTORY — PX: SHOULDER ARTHROSCOPY WITH ROTATOR CUFF REPAIR: SHX5685

## 2013-08-22 LAB — GLUCOSE, CAPILLARY
GLUCOSE-CAPILLARY: 131 mg/dL — AB (ref 70–99)
GLUCOSE-CAPILLARY: 144 mg/dL — AB (ref 70–99)

## 2013-08-22 SURGERY — ARTHROSCOPY, SHOULDER, WITH ROTATOR CUFF REPAIR
Anesthesia: General | Site: Shoulder | Laterality: Left

## 2013-08-22 MED ORDER — HYDROMORPHONE HCL PF 1 MG/ML IJ SOLN: 0.2500 mg | INTRAMUSCULAR | Status: DC | PRN

## 2013-08-22 MED ORDER — GLYCOPYRROLATE 0.2 MG/ML IJ SOLN
INTRAMUSCULAR | Status: AC
  Filled 2013-08-22: qty 1

## 2013-08-22 MED ORDER — CHLORHEXIDINE GLUCONATE 4 % EX LIQD: 60.0000 mL | Freq: Once | CUTANEOUS | Status: DC

## 2013-08-22 MED ORDER — ROCURONIUM BROMIDE 50 MG/5ML IV SOLN
INTRAVENOUS | Status: AC
  Filled 2013-08-22: qty 1

## 2013-08-22 MED ORDER — SODIUM CHLORIDE 0.9 % IJ SOLN
INTRAMUSCULAR | Status: AC
  Filled 2013-08-22: qty 10

## 2013-08-22 MED ORDER — PROPOFOL 10 MG/ML IV BOLUS
INTRAVENOUS | Status: DC | PRN
  Administered 2013-08-22: 200 mg via INTRAVENOUS

## 2013-08-22 MED ORDER — PHENYLEPHRINE HCL 10 MG/ML IJ SOLN
10.0000 mg | INTRAVENOUS | Status: DC | PRN
  Administered 2013-08-22: 50 ug/min via INTRAVENOUS

## 2013-08-22 MED ORDER — SUCCINYLCHOLINE CHLORIDE 20 MG/ML IJ SOLN
INTRAMUSCULAR | Status: DC | PRN
  Administered 2013-08-22: 140 mg via INTRAVENOUS

## 2013-08-22 MED ORDER — LACTATED RINGERS IV SOLN
INTRAVENOUS | Status: DC | PRN
  Administered 2013-08-22 (×2): via INTRAVENOUS

## 2013-08-22 MED ORDER — FENTANYL CITRATE 0.05 MG/ML IJ SOLN
INTRAMUSCULAR | Status: AC
  Administered 2013-08-22: 50 ug
  Filled 2013-08-22: qty 2

## 2013-08-22 MED ORDER — DEXAMETHASONE SODIUM PHOSPHATE 4 MG/ML IJ SOLN
INTRAMUSCULAR | Status: DC | PRN
  Administered 2013-08-22: 4 mg via INTRAVENOUS

## 2013-08-22 MED ORDER — OXYCODONE HCL 5 MG PO TABS: 5.0000 mg | ORAL_TABLET | Freq: Once | ORAL | Status: DC | PRN

## 2013-08-22 MED ORDER — FENTANYL CITRATE 0.05 MG/ML IJ SOLN: 50.0000 ug | INTRAMUSCULAR | Status: DC | PRN

## 2013-08-22 MED ORDER — SUCCINYLCHOLINE CHLORIDE 20 MG/ML IJ SOLN
INTRAMUSCULAR | Status: AC
  Filled 2013-08-22: qty 1

## 2013-08-22 MED ORDER — EPHEDRINE SULFATE 50 MG/ML IJ SOLN
INTRAMUSCULAR | Status: AC
  Filled 2013-08-22: qty 1

## 2013-08-22 MED ORDER — OXYCODONE-ACETAMINOPHEN 5-325 MG PO TABS: 1.0000 | ORAL_TABLET | Freq: Four times a day (QID) | ORAL | Status: DC | PRN

## 2013-08-22 MED ORDER — LIDOCAINE HCL (CARDIAC) 20 MG/ML IV SOLN
INTRAVENOUS | Status: AC
  Filled 2013-08-22: qty 5

## 2013-08-22 MED ORDER — NEOSTIGMINE METHYLSULFATE 1 MG/ML IJ SOLN
INTRAMUSCULAR | Status: AC
  Filled 2013-08-22: qty 10

## 2013-08-22 MED ORDER — ONDANSETRON HCL 4 MG/2ML IJ SOLN
INTRAMUSCULAR | Status: AC
Start: 2013-08-22 — End: 2013-08-22
  Filled 2013-08-22: qty 2

## 2013-08-22 MED ORDER — GLYCOPYRROLATE 0.2 MG/ML IJ SOLN
INTRAMUSCULAR | Status: DC | PRN
  Administered 2013-08-22: 0.4 mg via INTRAVENOUS

## 2013-08-22 MED ORDER — MIDAZOLAM HCL 2 MG/2ML IJ SOLN: 1.0000 mg | INTRAMUSCULAR | Status: DC | PRN

## 2013-08-22 MED ORDER — MIDAZOLAM HCL 2 MG/2ML IJ SOLN
INTRAMUSCULAR | Status: AC
Start: 2013-08-22 — End: 2013-08-22
  Filled 2013-08-22: qty 2

## 2013-08-22 MED ORDER — FENTANYL CITRATE 0.05 MG/ML IJ SOLN
INTRAMUSCULAR | Status: DC | PRN
  Administered 2013-08-22: 50 ug via INTRAVENOUS

## 2013-08-22 MED ORDER — BUPIVACAINE HCL (PF) 0.25 % IJ SOLN
INTRAMUSCULAR | Status: AC
  Filled 2013-08-22: qty 30

## 2013-08-22 MED ORDER — PHENYLEPHRINE 40 MCG/ML (10ML) SYRINGE FOR IV PUSH (FOR BLOOD PRESSURE SUPPORT)
PREFILLED_SYRINGE | INTRAVENOUS | Status: AC
  Filled 2013-08-22: qty 10

## 2013-08-22 MED ORDER — OXYCODONE HCL 5 MG/5ML PO SOLN
5.0000 mg | Freq: Once | ORAL | Status: DC | PRN
Start: 2013-08-22 — End: 2013-08-22

## 2013-08-22 MED ORDER — PROPOFOL 10 MG/ML IV BOLUS
INTRAVENOUS | Status: AC
  Filled 2013-08-22: qty 20

## 2013-08-22 MED ORDER — LACTATED RINGERS IV SOLN
INTRAVENOUS | Status: DC
  Administered 2013-08-22: 08:00:00 via INTRAVENOUS

## 2013-08-22 MED ORDER — NEOSTIGMINE METHYLSULFATE 1 MG/ML IJ SOLN
INTRAMUSCULAR | Status: DC | PRN
  Administered 2013-08-22: 3 mg via INTRAVENOUS

## 2013-08-22 MED ORDER — LIDOCAINE HCL (CARDIAC) 20 MG/ML IV SOLN
INTRAVENOUS | Status: DC | PRN
  Administered 2013-08-22: 40 mg via INTRAVENOUS

## 2013-08-22 MED ORDER — BUPIVACAINE HCL (PF) 0.25 % IJ SOLN
INTRAMUSCULAR | Status: DC | PRN
  Administered 2013-08-22: 15 mL via PERINEURAL

## 2013-08-22 MED ORDER — EPINEPHRINE HCL 1 MG/ML IJ SOLN
INTRAMUSCULAR | Status: DC | PRN
  Administered 2013-08-22: .01 mg via SUBCUTANEOUS

## 2013-08-22 MED ORDER — ONDANSETRON HCL 4 MG/2ML IJ SOLN
INTRAMUSCULAR | Status: DC | PRN
  Administered 2013-08-22: 4 mg via INTRAVENOUS

## 2013-08-22 MED ORDER — SODIUM CHLORIDE 0.9 % IR SOLN
Status: DC | PRN
  Administered 2013-08-22: 7000 mL

## 2013-08-22 MED ORDER — SODIUM CHLORIDE 0.9 % IJ SOLN
INTRAMUSCULAR | Status: DC | PRN
  Administered 2013-08-22: 50 mL via INTRAVENOUS

## 2013-08-22 MED ORDER — MIDAZOLAM HCL 2 MG/2ML IJ SOLN
INTRAMUSCULAR | Status: AC
  Administered 2013-08-22: 1 mg
  Filled 2013-08-22: qty 2

## 2013-08-22 MED ORDER — ROCURONIUM BROMIDE 100 MG/10ML IV SOLN
INTRAVENOUS | Status: DC | PRN
  Administered 2013-08-22: 20 mg via INTRAVENOUS

## 2013-08-22 MED ORDER — METHOCARBAMOL 500 MG PO TABS: 500.0000 mg | ORAL_TABLET | Freq: Four times a day (QID) | ORAL | Status: DC

## 2013-08-22 MED ORDER — GLYCOPYRROLATE 0.2 MG/ML IJ SOLN
INTRAMUSCULAR | Status: AC
  Filled 2013-08-22: qty 2

## 2013-08-22 MED ORDER — EPINEPHRINE HCL 1 MG/ML IJ SOLN
INTRAMUSCULAR | Status: AC
  Filled 2013-08-22: qty 1

## 2013-08-22 MED ORDER — METOCLOPRAMIDE HCL 5 MG/ML IJ SOLN: 10.0000 mg | Freq: Once | INTRAMUSCULAR | Status: DC | PRN

## 2013-08-22 MED ORDER — FENTANYL CITRATE 0.05 MG/ML IJ SOLN
INTRAMUSCULAR | Status: AC
  Filled 2013-08-22: qty 5

## 2013-08-22 SURGICAL SUPPLY — 71 items
ANCHOR CORKSCREW BIO 5.5 FT (Anchor) ×6 IMPLANT
BENZOIN TINCTURE PRP APPL 2/3 (GAUZE/BANDAGES/DRESSINGS) ×3 IMPLANT
BIT DRILL TAK (DRILL) IMPLANT
BLADE CUDA 5.5 (BLADE) IMPLANT
BLADE CUTTER GATOR 3.5 (BLADE) ×3 IMPLANT
BLADE GREAT WHITE 4.2 (BLADE) ×2 IMPLANT
BLADE GREAT WHITE 4.2MM (BLADE) ×1
BLADE SURG 11 STRL SS (BLADE) ×3 IMPLANT
BUR GATOR 2.9 (BURR) IMPLANT
BUR GATOR 2.9MM (BURR)
BUR OVAL 6.0 (BURR) ×3 IMPLANT
CANNULA SHOULDER 7CM (CANNULA) IMPLANT
CARTRIDGE CURVETEK MED (MISCELLANEOUS) IMPLANT
CARTRIDGE CURVETEK XLRG (MISCELLANEOUS) IMPLANT
CLOSURE WOUND 1/2 X4 (GAUZE/BANDAGES/DRESSINGS) ×1
COVER SURGICAL LIGHT HANDLE (MISCELLANEOUS) ×3 IMPLANT
DRAPE INCISE IOBAN 66X45 STRL (DRAPES) ×6 IMPLANT
DRAPE STERI 35X30 U-POUCH (DRAPES) ×3 IMPLANT
DRAPE U-SHAPE 47X51 STRL (DRAPES) ×6 IMPLANT
DRILL TAK (DRILL)
DRSG PAD ABDOMINAL 8X10 ST (GAUZE/BANDAGES/DRESSINGS) ×9 IMPLANT
DURAPREP 26ML APPLICATOR (WOUND CARE) ×3 IMPLANT
ELECT MENISCUS 165MM 90D (ELECTRODE) IMPLANT
ELECT REM PT RETURN 9FT ADLT (ELECTROSURGICAL) ×3
ELECTRODE REM PT RTRN 9FT ADLT (ELECTROSURGICAL) ×1 IMPLANT
FILTER STRAW FLUID ASPIR (MISCELLANEOUS) ×3 IMPLANT
GAUZE XEROFORM 1X8 LF (GAUZE/BANDAGES/DRESSINGS) ×3 IMPLANT
GLOVE BIOGEL PI IND STRL 8 (GLOVE) ×1 IMPLANT
GLOVE BIOGEL PI INDICATOR 8 (GLOVE) ×2
GLOVE SURG ORTHO 8.0 STRL STRW (GLOVE) ×3 IMPLANT
GOWN STRL REUS W/ TWL LRG LVL3 (GOWN DISPOSABLE) ×3 IMPLANT
GOWN STRL REUS W/TWL LRG LVL3 (GOWN DISPOSABLE) ×6
KIT BASIN OR (CUSTOM PROCEDURE TRAY) ×3 IMPLANT
KIT ROOM TURNOVER OR (KITS) ×3 IMPLANT
MANIFOLD NEPTUNE II (INSTRUMENTS) ×3 IMPLANT
NDL SUT 6 .5 CRC .975X.05 MAYO (NEEDLE) ×1 IMPLANT
NEEDLE HYPO 25X1 1.5 SAFETY (NEEDLE) ×3 IMPLANT
NEEDLE MAYO TAPER (NEEDLE) ×2
NEEDLE SPNL 18GX3.5 QUINCKE PK (NEEDLE) ×3 IMPLANT
NS IRRIG 1000ML POUR BTL (IV SOLUTION) ×3 IMPLANT
PACK SHOULDER (CUSTOM PROCEDURE TRAY) ×3 IMPLANT
PAD ABD 8X10 STRL (GAUZE/BANDAGES/DRESSINGS) ×6 IMPLANT
PAD ARMBOARD 7.5X6 YLW CONV (MISCELLANEOUS) ×6 IMPLANT
PUSHLOCK PEEK 4.5X24 (Orthopedic Implant) ×6 IMPLANT
SET ARTHROSCOPY TUBING (MISCELLANEOUS) ×2
SET ARTHROSCOPY TUBING LN (MISCELLANEOUS) ×1 IMPLANT
SLING ARM IMMOBILIZER MED (SOFTGOODS) IMPLANT
SLING ARM IMMOBILIZER XL (CAST SUPPLIES) ×3 IMPLANT
SPEAR FASTAKII (SLEEVE) IMPLANT
SPONGE GAUZE 4X4 12PLY (GAUZE/BANDAGES/DRESSINGS) ×3 IMPLANT
SPONGE LAP 4X18 X RAY DECT (DISPOSABLE) ×6 IMPLANT
STRIP CLOSURE SKIN 1/2X4 (GAUZE/BANDAGES/DRESSINGS) ×2 IMPLANT
SUCTION FRAZIER TIP 10 FR DISP (SUCTIONS) ×3 IMPLANT
SUT ETHILON 3 0 PS 1 (SUTURE) ×3 IMPLANT
SUT FIBERWIRE 2-0 18 17.9 3/8 (SUTURE)
SUT PROLENE 3 0 PS 2 (SUTURE) ×3 IMPLANT
SUT VIC AB 0 CT1 27 (SUTURE) ×4
SUT VIC AB 0 CT1 27XBRD ANBCTR (SUTURE) ×2 IMPLANT
SUT VIC AB 1 CT1 27 (SUTURE) ×2
SUT VIC AB 1 CT1 27XBRD ANBCTR (SUTURE) ×1 IMPLANT
SUT VIC AB 2-0 CT1 27 (SUTURE) ×2
SUT VIC AB 2-0 CT1 TAPERPNT 27 (SUTURE) ×1 IMPLANT
SUT VICRYL 0 UR6 27IN ABS (SUTURE) IMPLANT
SUTURE FIBERWR 2-0 18 17.9 3/8 (SUTURE) IMPLANT
SYR 20CC LL (SYRINGE) ×6 IMPLANT
SYR 3ML LL SCALE MARK (SYRINGE) ×3 IMPLANT
SYR TB 1ML LUER SLIP (SYRINGE) ×3 IMPLANT
TOWEL OR 17X24 6PK STRL BLUE (TOWEL DISPOSABLE) ×3 IMPLANT
TOWEL OR 17X26 10 PK STRL BLUE (TOWEL DISPOSABLE) ×3 IMPLANT
WAND 90 DEG TURBOVAC W/CORD (SURGICAL WAND) ×3 IMPLANT
WATER STERILE IRR 1000ML POUR (IV SOLUTION) ×3 IMPLANT

## 2013-08-22 NOTE — Brief Op Note (Signed)
08/22/2013  10:30 AM  PATIENT:  Eppie GibsonMary Lovan  68 y.o. female  PRE-OPERATIVE DIAGNOSIS:  left shoulder bursitis, rotator cuff   POST-OPERATIVE DIAGNOSIS:  left shoulder bursitis with rotator cuff tear   PROCEDURE:  Procedure(s): SHOULDER ARTHROSCOPY with debridement WITH Open ROTATOR CUFF REPAIR, extensive debridement SAD  SURGEON:  Surgeon(s): Cammy CopaGregory Scott Dean, MD  ASSISTANT: s vernon pa   ANESTHESIA:   general  EBL: 25 ml    Total I/O In: 1000 [I.V.:1000] Out: 100 [Blood:100]  BLOOD ADMINISTERED: none  DRAINS: none   LOCAL MEDICATIONS USED:  none  SPECIMEN:  No Specimen  COUNTS:  YES  TOURNIQUET:  * No tourniquets in log *  DICTATION: .Other Dictation: Dictation Number [id number]  PLAN OF CARE: Discharge to home after PACU  PATIENT DISPOSITION:  PACU - hemodynamically stable

## 2013-08-22 NOTE — Anesthesia Postprocedure Evaluation (Signed)
Anesthesia Post Note  Patient: Alexa GibsonMary Carson  Procedure(s) Performed: Procedure(s) (LRB): SHOULDER ARTHROSCOPY with debridement WITH Open ROTATOR CUFF REPAIR (Left)  Anesthesia type: General  Patient location: PACU  Post pain: Pain level controlled  Post assessment: Patient's Cardiovascular Status Stable  Last Vitals:  Filed Vitals:   08/22/13 1200  BP: 121/81  Pulse: 87  Temp:   Resp: 19    Post vital signs: Reviewed and stable  Level of consciousness: alert  Complications: No apparent anesthesia complications

## 2013-08-22 NOTE — Transfer of Care (Signed)
Immediate Anesthesia Transfer of Care Note  [name]  Procedure(s) Performed: Procedure(s): SHOULDER ARTHROSCOPY with debridement WITH Open ROTATOR CUFF REPAIR (Left)  Patient Location: PACU  Anesthesia Type:General  Level of Consciousness: awake, alert , oriented and patient cooperative  Airway & Oxygen Therapy: Patient Spontanous Breathing and Patient connected to face mask oxygen  Post-op Assessment: Report given to PACU RN and Post -op Vital signs reviewed and stable  Post vital signs: Reviewed  Complications: No apparent anesthesia complications

## 2013-08-22 NOTE — Discharge Instructions (Signed)

## 2013-08-22 NOTE — Anesthesia Preprocedure Evaluation (Signed)
Anesthesia Evaluation  Patient identified by MRN, date of birth, ID band Patient awake    Reviewed: Allergy & Precautions, H&P , NPO status , Patient's Chart, lab work & pertinent test results, reviewed documented beta blocker date and time   Airway Mallampati: II TM Distance: >3 FB Neck ROM: full    Dental   Pulmonary asthma , sleep apnea , pneumonia -, resolved,  breath sounds clear to auscultation        Cardiovascular Exercise Tolerance: Good hypertension, On Medications Rhythm:regular     Neuro/Psych negative neurological ROS  negative psych ROS   GI/Hepatic negative GI ROS, Neg liver ROS,   Endo/Other  Morbid obesity  Renal/GU negative Renal ROS  negative genitourinary   Musculoskeletal   Abdominal   Peds  Hematology negative hematology ROS (+)   Anesthesia Other Findings See surgeon's H&P   Reproductive/Obstetrics negative OB ROS                           Anesthesia Physical Anesthesia Plan  ASA: III  Anesthesia Plan: General   Post-op Pain Management:    Induction: Intravenous  Airway Management Planned: Oral ETT  Additional Equipment:   Intra-op Plan:   Post-operative Plan: Extubation in OR  Informed Consent: I have reviewed the patients History and Physical, chart, labs and discussed the procedure including the risks, benefits and alternatives for the proposed anesthesia with the patient or authorized representative who has indicated his/her understanding and acceptance.   Dental Advisory Given  Plan Discussed with: CRNA and Surgeon  Anesthesia Plan Comments:         Anesthesia Quick Evaluation

## 2013-08-22 NOTE — Anesthesia Procedure Notes (Addendum)
Anesthesia Regional Block:  Interscalene brachial plexus block  Pre-Anesthetic Checklist: ,, timeout performed, Correct Patient, Correct Site, Correct Laterality, Correct Procedure, Correct Position, site marked, Risks and benefits discussed,  Surgical consent,  Pre-op evaluation,  At surgeon's request and post-op pain management  Laterality: Left  Prep: chloraprep       Needles:   Needle Type: Other     Needle Length: 9cm 9 cm Needle Gauge: 21 and 21 G    Additional Needles:  Procedures: ultrasound guided (picture in chart) Interscalene brachial plexus block Narrative:  Start time: 08/22/2013 7:47 AM End time: 08/22/2013 7:55 AM Injection made incrementally with aspirations every 5 mL.  Performed by: Personally  Anesthesiologist: Aldona Lento Frederick, MD  Additional Notes: Ultrasound guidance used to: id relevant anatomy, confirm needle position, local anesthetic spread, avoidance of vascular puncture. Picture saved. No complications. Block performed personally by Janetta Horaharles E. Gelene MinkFrederick, MD     Procedure Name: Intubation Date/Time: 08/22/2013 8:40 AM Performed by: Lovie CholOCK, Lorance Pickeral K Pre-anesthesia Checklist: Patient identified, Emergency Drugs available, Suction available, Patient being monitored and Timeout performed Patient Re-evaluated:Patient Re-evaluated prior to inductionOxygen Delivery Method: Circle system utilized Preoxygenation: Pre-oxygenation with 100% oxygen Intubation Type: IV induction Ventilation: Mask ventilation without difficulty Laryngoscope Size: Miller and 2 Grade View: Grade I Tube type: Oral Tube size: 7.0 mm Number of attempts: 1 Airway Equipment and Method: Stylet Placement Confirmation: ETT inserted through vocal cords under direct vision,  positive ETCO2,  CO2 detector and breath sounds checked- equal and bilateral Secured at: 21 cm Tube secured with: Tape Dental Injury: Teeth and Oropharynx as per pre-operative assessment

## 2013-08-22 NOTE — Progress Notes (Signed)
Talked to Dr.Frederick abt sat in low 90's,ordered to watch for some more time till sat stays above 93, 94 without any concerns.  Johny,RN.

## 2013-08-22 NOTE — H&P (Signed)
Alexa Carson is an 68 y.o. female.   Chief Complaint: Left shoulder pain HPI: Alexa Carson is a 68 year old female with left shoulder pain. She describes fairly long history left shoulder pain without history of injury. Workup has demonstrated that she has significant arthritis in the shoulder as well as rotator cuff tear. Patient has failed nonoperative management including injections activity modification and medical management. She reports night pain and rest pain as well as weakness. I think that she has significant arthritis as well as rotator cuff tearing.  Past Medical History  Diagnosis Date  . Hypertension   . Asthma   . Sleep apnea     cpap  . Pneumonia     child  . History of kidney stones   . Arthritis     Past Surgical History  Procedure Laterality Date  . Abdominal hysterectomy  82  . Bladder neck suspension  82  . Cholecystectomy  70  . Breast surgery  93    augmentation  . Joint replacement  12    left knee  . Total knee arthroplasty Right 04/04/2013    Procedure: TOTAL KNEE ARTHROPLASTY;  Surgeon: Alexa Copa, MD;  Location: [facility] OR;  Service: Orthopedics;  Laterality: Right;    No family history on file. Social History:  reports that she has never smoked. She does not have any smokeless tobacco history on file. She reports that she does not drink alcohol or use illicit drugs.  Allergies:  Allergies  Allergen Reactions  . Augmentin [Amoxicillin-Pot Clavulanate] Hives  . Penicillins Other (See Comments)    unknown  . Sulfa Antibiotics Other (See Comments)    Upset stomach, harder to breathe  . Latex Rash    Burn with adhesive    Medications Prior to Admission  Medication Sig Dispense Refill  . albuterol (PROVENTIL HFA;VENTOLIN HFA) 108 (90 BASE) MCG/ACT inhaler Inhale into the lungs every 6 (six) hours as needed for wheezing or shortness of breath.      Marland Kitchen alendronate (FOSAMAX) 70 MG tablet Take 70 mg by mouth once a week. Takes on Sundays      . atorvastatin  (LIPITOR) 20 MG tablet Take 20 mg by mouth daily.      . Canagliflozin (INVOKANA) 100 MG TABS Take by mouth.      . celecoxib (CELEBREX) 200 MG capsule Take 200 mg by mouth daily.      . cholecalciferol (VITAMIN D) 1000 UNITS tablet Take 2,000 Units by mouth daily.       Marland Kitchen lisinopril-hydrochlorothiazide (PRINZIDE,ZESTORETIC) 20-12.5 MG per tablet Take 1 tablet by mouth daily.      . Fluticasone-Salmeterol (ADVAIR) 500-50 MCG/DOSE AEPB Inhale 1 puff into the lungs daily as needed (shortness of breath or wheezing).       . montelukast (SINGULAIR) 10 MG tablet Take 10 mg by mouth at bedtime.        Results for orders placed during the hospital encounter of 08/22/13 (from the past 48 hour(s))  GLUCOSE, CAPILLARY     Status: Abnormal   Collection Time    08/22/13  6:38 AM      Result Value Ref Range   Glucose-Capillary 131 (*) 70 - 99 mg/dL   No results found.  Review of Systems  Constitutional: Negative.   HENT: Negative.   Eyes: Negative.   Respiratory: Negative.   Cardiovascular: Negative.   Gastrointestinal: Negative.   Genitourinary: Negative.   Musculoskeletal: Negative.   Skin: Negative.   Neurological: Negative.   Endo/Heme/Allergies:  Negative.   Psychiatric/Behavioral: Negative.     Blood pressure 121/89, pulse 90, temperature 98.4 F (36.9 C), temperature source Oral, resp. rate 20, SpO2 100.00%. Physical Exam  Constitutional: She appears well-developed.  HENT:  Head: Normocephalic.  Eyes: Pupils are equal, round, and reactive to light.  Neck: Normal range of motion.  Cardiovascular: Normal rate.   Respiratory: Effort normal.  Neurological: She is alert.  Skin: Skin is warm.  Psychiatric: She has a normal mood and affect.   a sore exam demonstrates some coarse grinding crepitus with active and passive range of motion. She has had an extra rotation and 15 abduction to about 40. Rotator cuff strength intact the sutures to subscapularis and infraspinatus testing  slightly weaker to supraspinatus testing no definite a.c. 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 I think that eventually she may need to have a shoulder replacement for pain relief which is going to be closer to complete and with that his rotator cuff repair length; however, she got really rate proceed at less than it and intervention yet. I think that we can help some of her symptoms with arthroscopy possible biceps tenotomy debridement and a subsequent rotator cuff repair. Risks and benefits including but limited to infection shoulder stiffness incomplete pain relief need for more short surgical intervention discussed with the patient patient understands and agrees all questions answered  Alexa Carson 08/22/2013, 7:16 AM

## 2013-08-23 NOTE — Op Note (Signed)
NAMPhillips Grout:  Raczynski, Mkenzie                   ACCOUNT NO.:  [id number]  MEDICAL RECORD NO.:  [id number]  LOCATION:  [facility]                         FACILITY:  [facility]  PHYSICIAN:  Burnard BuntingG. Scott Yvanna Vidas, M.D.    DATE OF BIRTH:  09-02-1945  DATE OF PROCEDURE: DATE OF DISCHARGE:  08/22/2013                              OPERATIVE REPORT   PREOPERATIVE DIAGNOSES:  Left shoulder rotator cuff tear, bursitis, and arthritis.  POSTOPERATIVE DIAGNOSES:  Left shoulder rotator cuff tear, bursitis, and arthritis.  PROCEDURE:  Left shoulder diagnostic arthroscopy, subacromial decompression, extensive debridement within the joint with release of the rotator interval, mini open rotator cuff repair of a 2 x 1 cm rotator cuff tear.  SURGEON:  Burnard BuntingG. Scott Kevork Joyce, M.D.  ASSISTANT:  Wende NeighborsSheila M. Vernon, P.A.  ANESTHESIA:  General endotracheal.  ESTIMATED BLOOD LOSS:  25 mL.  INDICATIONS:  Alexa Carson is a patient with left shoulder pain.  She has arthritis and rotator cuff tear on MRI scan.  She presents now for operative management after explanation of risks and benefits.  PROCEDURE IN DETAIL:  The patient was brought to the operating room where general endotracheal anesthesia was induced.  Preoperative antibiotics administered.  Time-out was called.  The patient was placed in a beach-chair position with the head in neutral position.  Left shoulder was prescrubbed with alcohol and Betadine, which allowed to air dry.  Prepped with DuraPrep solution and draped in sterile manner. Axilla was covered with Ioban.  Posterior portal created 2 cm medial and inferior to the posterolateral margin of the acromion.  Diagnostic arthroscopy was performed.  Anterior portal created under direct visualization.  Significant synovitis was present.  This was extensively debrided within the rotator interval and around the labrum using ArthroCare wand and shaver.  Rotator cuff tear was identified, fraying around the biceps tendon, but not  involving the biceps tendon, was also debrided.  At this time, the rotator cuff tear was deemed full-thickness and sizable.  Subacromial decompression bursectomy was performed with assistance of lateral portal.  Prior to completing the decompression, the patient was noted to have in accordance with MRI scanning __________.  No soft tissue attachments were removed from the __________.  The subacromial decompression involved primarily the under surface of the clavicle, which did have a spur along with bursectomy. At this time, instruments were removed from the portals and closed using 3-0 nylon.  Repeat DuraPrep performed, shoulder covered with Ioban, __________, the skin and subcutaneous tissue sharply divided, deltoid split, measured distance 4 cm from the anterolateral margin of the acromion.  Stay suture placed.  Rotator cuff tear was identified.  It was fairly sizable extending up to the bleeding edge of the supraspinatus.  Watertight repair was achieved using 2 push lock suture anchors and 2 corkscrews.  The bony bed was prepared using a curette. Care was taken to avoid entrapment of the biceps tendon.  At this time, the retractors were removed. Thorough irrigation performed, deltoid split closed using 0 Vicryl suture followed by 2-0 Vicryl suture and 3-0 Prolene.  Bulky dressing applied.  The patient tolerated the procedure well without immediate complication.  Velna HatchetSheila Vernon's assistance was required  for opening and closing retraction.  Her assistance was medical necessity.     Burnard Bunting, M.D.     GSD/MEDQ  D:  08/22/2013  T:  08/22/2013  Job:  [id number]

## 2013-08-24 ENCOUNTER — Encounter (HOSPITAL_COMMUNITY): Payer: Self-pay | Admitting: Orthopedic Surgery

## 2013-09-05 ENCOUNTER — Ambulatory Visit: Payer: Medicare Other | Attending: Orthopedic Surgery | Admitting: Physical Therapy

## 2013-09-05 DIAGNOSIS — M25519 Pain in unspecified shoulder: Secondary | ICD-10-CM | POA: Insufficient documentation

## 2013-09-05 DIAGNOSIS — M25619 Stiffness of unspecified shoulder, not elsewhere classified: Secondary | ICD-10-CM | POA: Diagnosis not present

## 2013-09-05 DIAGNOSIS — R609 Edema, unspecified: Secondary | ICD-10-CM | POA: Insufficient documentation

## 2013-09-05 DIAGNOSIS — IMO0001 Reserved for inherently not codable concepts without codable children: Secondary | ICD-10-CM | POA: Insufficient documentation

## 2013-09-07 ENCOUNTER — Encounter: Payer: Medicare Other | Admitting: Rehabilitation

## 2013-09-08 ENCOUNTER — Ambulatory Visit: Payer: Medicare Other | Admitting: Physical Therapy

## 2013-09-08 DIAGNOSIS — IMO0001 Reserved for inherently not codable concepts without codable children: Secondary | ICD-10-CM | POA: Diagnosis not present

## 2013-09-11 ENCOUNTER — Ambulatory Visit: Payer: Medicare Other | Admitting: Physical Therapy

## 2013-09-11 DIAGNOSIS — IMO0001 Reserved for inherently not codable concepts without codable children: Secondary | ICD-10-CM | POA: Diagnosis not present

## 2013-09-14 ENCOUNTER — Ambulatory Visit: Payer: Medicare Other | Admitting: Physical Therapy

## 2013-09-14 DIAGNOSIS — IMO0001 Reserved for inherently not codable concepts without codable children: Secondary | ICD-10-CM | POA: Diagnosis not present

## 2013-09-18 ENCOUNTER — Ambulatory Visit: Payer: Medicare Other | Admitting: Physical Therapy

## 2013-09-18 ENCOUNTER — Ambulatory Visit: Payer: Medicare Other | Attending: Orthopedic Surgery | Admitting: Rehabilitation

## 2013-09-18 ENCOUNTER — Encounter: Payer: Medicare Other | Admitting: Rehabilitation

## 2013-09-18 DIAGNOSIS — M25619 Stiffness of unspecified shoulder, not elsewhere classified: Secondary | ICD-10-CM | POA: Insufficient documentation

## 2013-09-18 DIAGNOSIS — R609 Edema, unspecified: Secondary | ICD-10-CM | POA: Diagnosis not present

## 2013-09-18 DIAGNOSIS — IMO0001 Reserved for inherently not codable concepts without codable children: Secondary | ICD-10-CM | POA: Insufficient documentation

## 2013-09-18 DIAGNOSIS — M25519 Pain in unspecified shoulder: Secondary | ICD-10-CM | POA: Insufficient documentation

## 2013-09-21 ENCOUNTER — Ambulatory Visit: Payer: Medicare Other | Admitting: Physical Therapy

## 2013-09-21 DIAGNOSIS — IMO0001 Reserved for inherently not codable concepts without codable children: Secondary | ICD-10-CM | POA: Diagnosis not present

## 2013-09-25 ENCOUNTER — Ambulatory Visit: Payer: Medicare Other | Admitting: Physical Therapy

## 2013-09-25 DIAGNOSIS — IMO0001 Reserved for inherently not codable concepts without codable children: Secondary | ICD-10-CM | POA: Diagnosis not present

## 2013-09-28 ENCOUNTER — Ambulatory Visit: Payer: Medicare Other | Admitting: Physical Therapy

## 2013-09-28 DIAGNOSIS — IMO0001 Reserved for inherently not codable concepts without codable children: Secondary | ICD-10-CM | POA: Diagnosis not present

## 2013-10-02 ENCOUNTER — Ambulatory Visit: Payer: Medicare Other | Admitting: Physical Therapy

## 2013-10-02 DIAGNOSIS — IMO0001 Reserved for inherently not codable concepts without codable children: Secondary | ICD-10-CM | POA: Diagnosis not present

## 2013-10-05 ENCOUNTER — Ambulatory Visit: Payer: Medicare Other | Admitting: Physical Therapy

## 2013-10-05 DIAGNOSIS — IMO0001 Reserved for inherently not codable concepts without codable children: Secondary | ICD-10-CM | POA: Diagnosis not present

## 2013-10-10 ENCOUNTER — Ambulatory Visit: Payer: Medicare Other | Admitting: Physical Therapy

## 2013-10-10 DIAGNOSIS — IMO0001 Reserved for inherently not codable concepts without codable children: Secondary | ICD-10-CM | POA: Diagnosis not present

## 2013-10-12 ENCOUNTER — Ambulatory Visit: Payer: Medicare Other | Admitting: Physical Therapy

## 2013-10-12 DIAGNOSIS — IMO0001 Reserved for inherently not codable concepts without codable children: Secondary | ICD-10-CM | POA: Diagnosis not present

## 2013-10-18 ENCOUNTER — Ambulatory Visit: Payer: Medicare Other | Attending: Orthopedic Surgery | Admitting: Physical Therapy

## 2013-10-18 DIAGNOSIS — R609 Edema, unspecified: Secondary | ICD-10-CM | POA: Insufficient documentation

## 2013-10-18 DIAGNOSIS — IMO0001 Reserved for inherently not codable concepts without codable children: Secondary | ICD-10-CM | POA: Diagnosis present

## 2013-10-18 DIAGNOSIS — M25519 Pain in unspecified shoulder: Secondary | ICD-10-CM | POA: Insufficient documentation

## 2013-10-18 DIAGNOSIS — M25619 Stiffness of unspecified shoulder, not elsewhere classified: Secondary | ICD-10-CM | POA: Insufficient documentation

## 2013-10-23 ENCOUNTER — Ambulatory Visit: Payer: Medicare Other | Admitting: Physical Therapy

## 2013-10-23 DIAGNOSIS — IMO0001 Reserved for inherently not codable concepts without codable children: Secondary | ICD-10-CM | POA: Diagnosis not present

## 2013-10-27 ENCOUNTER — Ambulatory Visit: Payer: Medicare Other | Admitting: Physical Therapy

## 2013-10-30 ENCOUNTER — Ambulatory Visit: Payer: Medicare Other | Admitting: Physical Therapy

## 2013-10-30 DIAGNOSIS — IMO0001 Reserved for inherently not codable concepts without codable children: Secondary | ICD-10-CM | POA: Diagnosis not present

## 2013-11-02 ENCOUNTER — Ambulatory Visit: Payer: Medicare Other | Admitting: Physical Therapy

## 2013-11-06 ENCOUNTER — Ambulatory Visit: Payer: Medicare Other | Admitting: Physical Therapy

## 2013-11-07 ENCOUNTER — Ambulatory Visit: Payer: Medicare Other | Admitting: Rehabilitation

## 2013-11-07 DIAGNOSIS — IMO0001 Reserved for inherently not codable concepts without codable children: Secondary | ICD-10-CM | POA: Diagnosis not present

## 2013-11-09 ENCOUNTER — Ambulatory Visit: Payer: Medicare Other | Admitting: Rehabilitation

## 2013-11-09 DIAGNOSIS — IMO0001 Reserved for inherently not codable concepts without codable children: Secondary | ICD-10-CM | POA: Diagnosis not present

## 2013-11-16 ENCOUNTER — Ambulatory Visit: Payer: Medicare Other | Admitting: Physical Therapy

## 2013-11-22 ENCOUNTER — Ambulatory Visit: Payer: Medicare Other | Attending: Orthopedic Surgery | Admitting: Physical Therapy

## 2013-11-22 DIAGNOSIS — M25519 Pain in unspecified shoulder: Secondary | ICD-10-CM | POA: Insufficient documentation

## 2013-11-22 DIAGNOSIS — R609 Edema, unspecified: Secondary | ICD-10-CM | POA: Insufficient documentation

## 2013-11-22 DIAGNOSIS — IMO0001 Reserved for inherently not codable concepts without codable children: Secondary | ICD-10-CM | POA: Insufficient documentation

## 2013-11-22 DIAGNOSIS — M25619 Stiffness of unspecified shoulder, not elsewhere classified: Secondary | ICD-10-CM | POA: Diagnosis not present

## 2013-11-27 ENCOUNTER — Ambulatory Visit: Payer: Medicare Other | Admitting: Rehabilitation

## 2013-11-30 ENCOUNTER — Ambulatory Visit: Payer: Medicare Other | Admitting: Rehabilitation

## 2013-11-30 DIAGNOSIS — IMO0001 Reserved for inherently not codable concepts without codable children: Secondary | ICD-10-CM | POA: Diagnosis not present

## 2013-12-04 ENCOUNTER — Ambulatory Visit: Payer: Medicare Other | Admitting: Rehabilitation

## 2013-12-04 DIAGNOSIS — IMO0001 Reserved for inherently not codable concepts without codable children: Secondary | ICD-10-CM | POA: Diagnosis not present

## 2013-12-07 ENCOUNTER — Ambulatory Visit: Payer: Medicare Other | Admitting: Rehabilitation

## 2013-12-11 ENCOUNTER — Ambulatory Visit: Payer: Medicare Other | Admitting: Rehabilitation

## 2013-12-11 DIAGNOSIS — IMO0001 Reserved for inherently not codable concepts without codable children: Secondary | ICD-10-CM | POA: Diagnosis not present

## 2013-12-14 ENCOUNTER — Ambulatory Visit: Payer: Medicare Other | Admitting: Rehabilitation

## 2014-01-22 IMAGING — CR DG CHEST 2V
2 series · 2 of 2 positions shown · non-contrast
Comparison: None.

CLINICAL DATA: Shortness of breath.

EXAM:
CHEST  2 VIEW

[w chest pa]
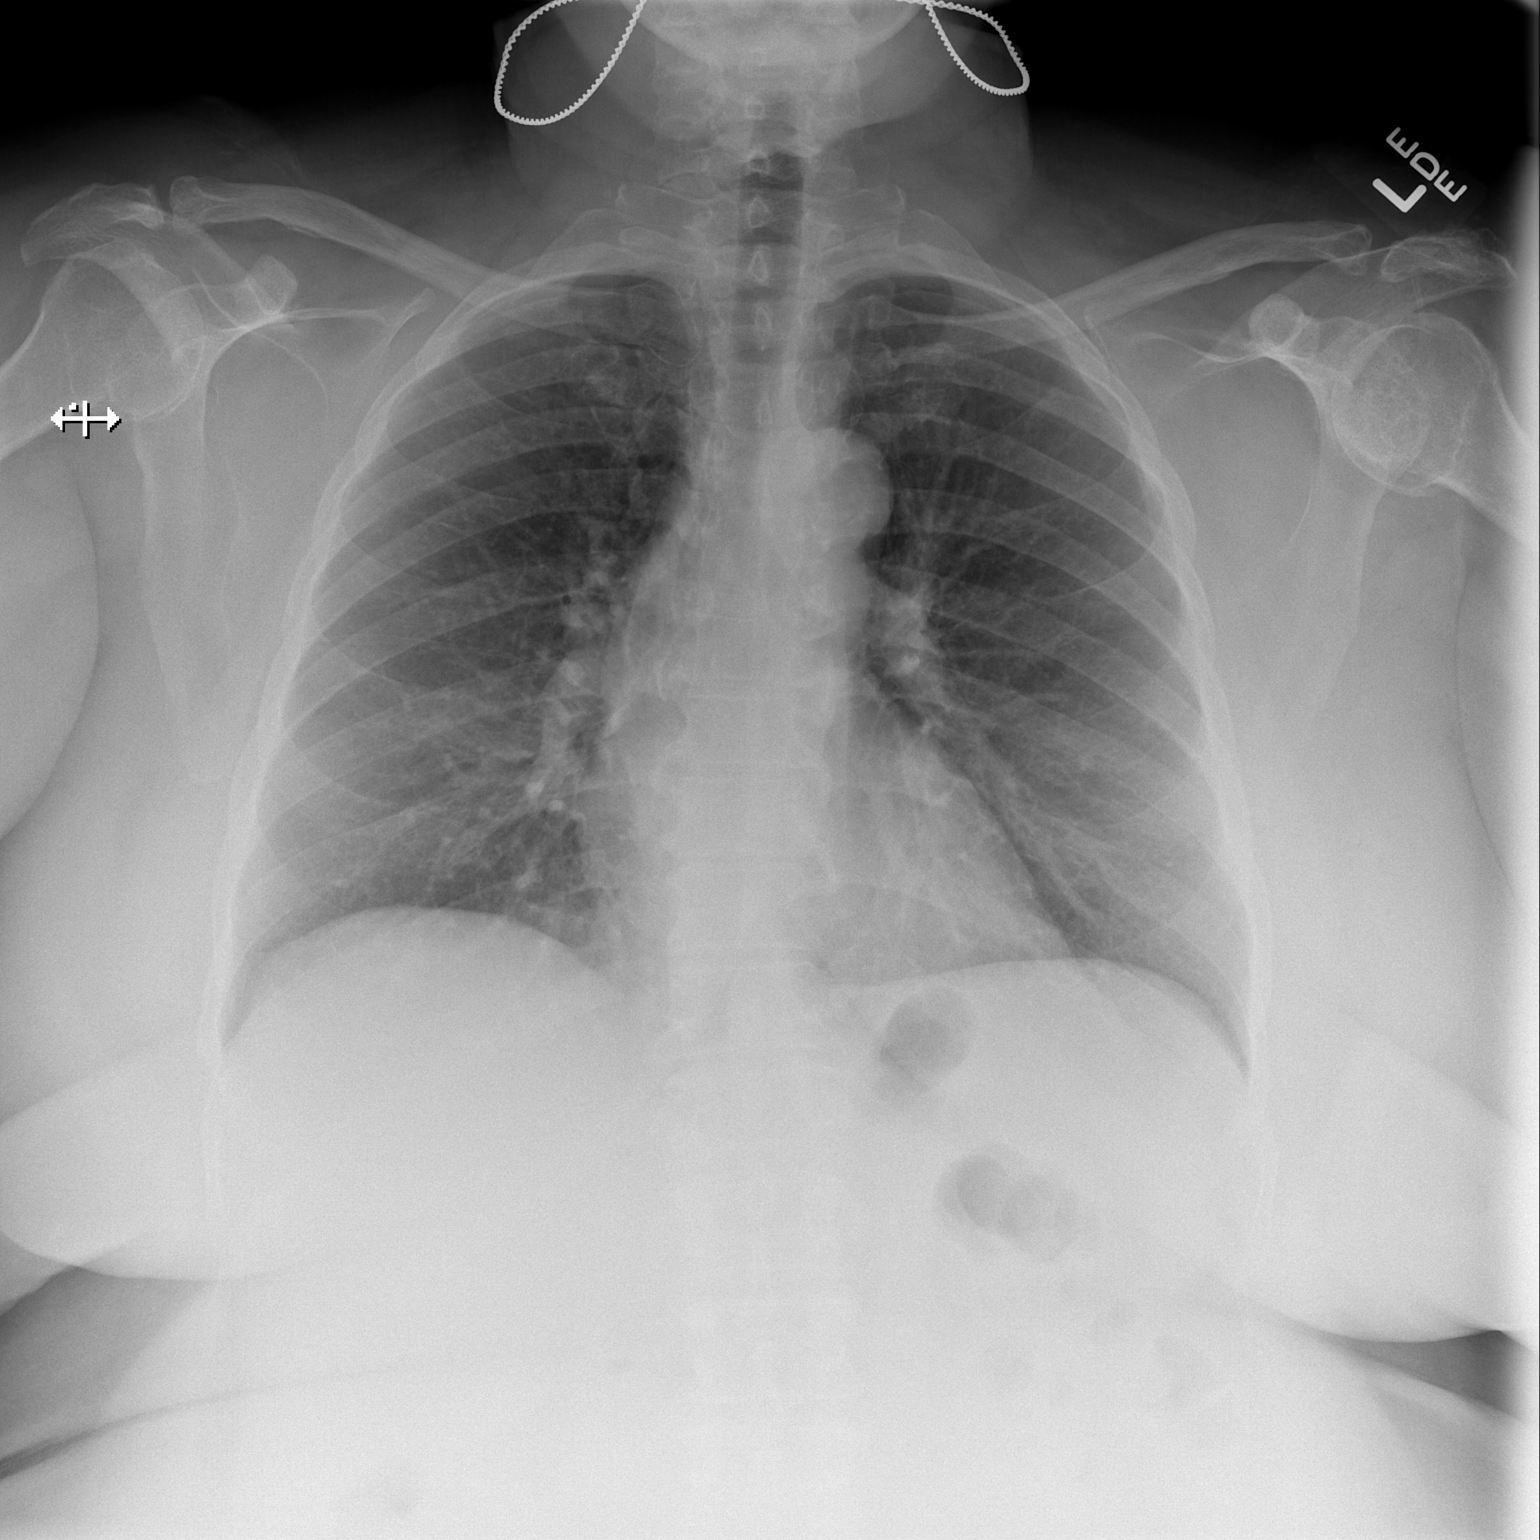

[w chest lat]
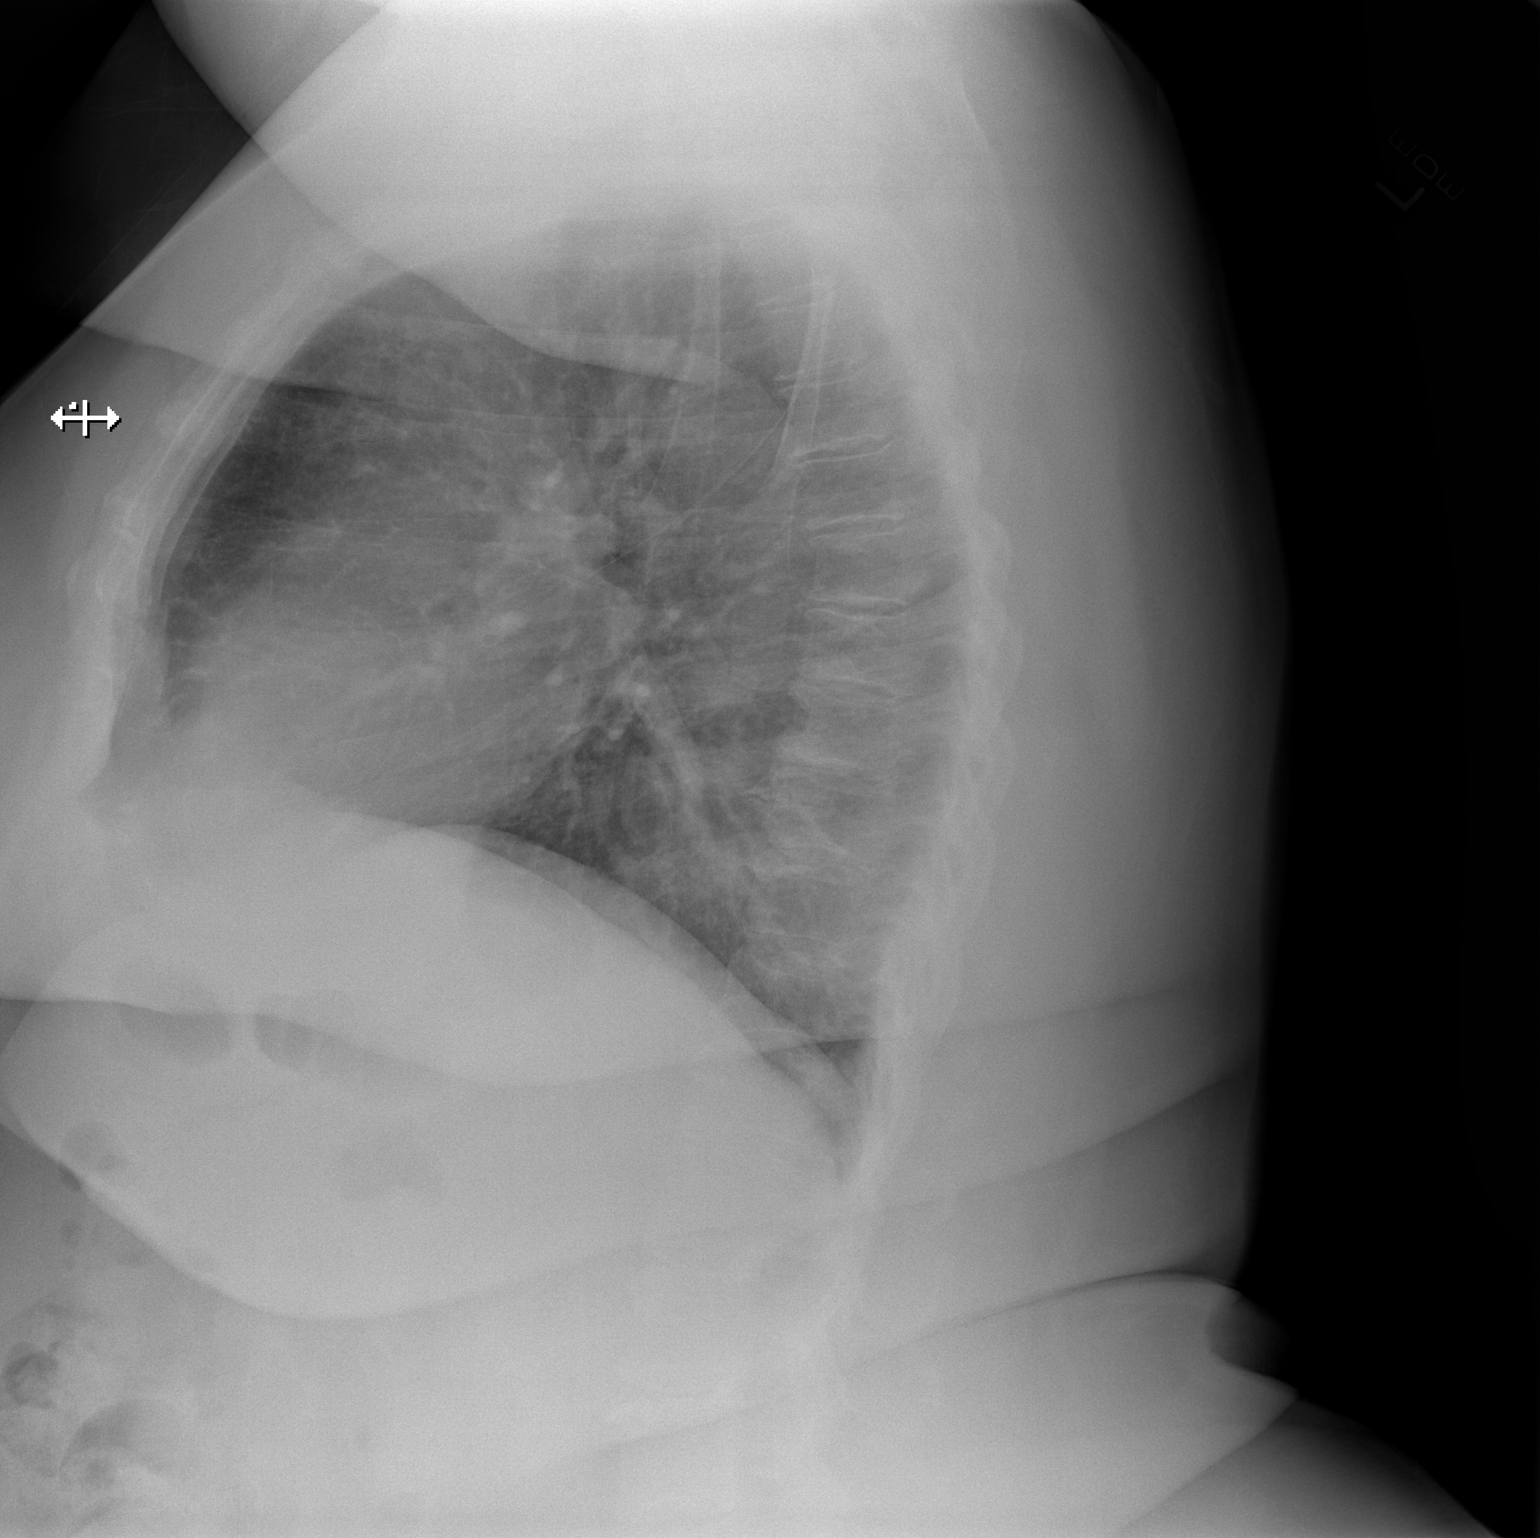

[2 of 2 positions shown; findings below may reference images not displayed]

FINDINGS: The heart size and mediastinal contours are within normal limits.
Both lungs are clear. The visualized skeletal structures are
unremarkable except for degenerative changes of the thoracic spine.
IMPRESSION: No active cardiopulmonary disease.

## 2014-01-31 ENCOUNTER — Ambulatory Visit: Payer: Medicare Other | Attending: Physical Medicine and Rehabilitation | Admitting: Physical Therapy

## 2014-01-31 DIAGNOSIS — M25519 Pain in unspecified shoulder: Secondary | ICD-10-CM | POA: Diagnosis not present

## 2014-01-31 DIAGNOSIS — IMO0001 Reserved for inherently not codable concepts without codable children: Secondary | ICD-10-CM | POA: Insufficient documentation

## 2014-01-31 DIAGNOSIS — M25619 Stiffness of unspecified shoulder, not elsewhere classified: Secondary | ICD-10-CM | POA: Diagnosis not present

## 2014-01-31 DIAGNOSIS — M810 Age-related osteoporosis without current pathological fracture: Secondary | ICD-10-CM | POA: Insufficient documentation

## 2014-01-31 DIAGNOSIS — I10 Essential (primary) hypertension: Secondary | ICD-10-CM | POA: Insufficient documentation

## 2014-01-31 DIAGNOSIS — J45909 Unspecified asthma, uncomplicated: Secondary | ICD-10-CM | POA: Diagnosis not present

## 2016-03-24 ENCOUNTER — Telehealth (INDEPENDENT_AMBULATORY_CARE_PROVIDER_SITE_OTHER): Payer: Self-pay | Admitting: Physical Medicine and Rehabilitation

## 2016-03-24 NOTE — Telephone Encounter (Signed)
Yes okay for right hip injection

## 2016-03-25 NOTE — Telephone Encounter (Signed)
No precert required for [facility]

## 2016-03-30 ENCOUNTER — Ambulatory Visit (INDEPENDENT_AMBULATORY_CARE_PROVIDER_SITE_OTHER): Payer: Medicare Other | Admitting: Physical Medicine and Rehabilitation

## 2016-03-30 ENCOUNTER — Encounter (INDEPENDENT_AMBULATORY_CARE_PROVIDER_SITE_OTHER): Payer: Self-pay | Admitting: Physical Medicine and Rehabilitation

## 2016-03-30 VITALS — BP 110/72 | HR 67

## 2016-03-30 DIAGNOSIS — M25551 Pain in right hip: Secondary | ICD-10-CM | POA: Diagnosis not present

## 2016-03-30 DIAGNOSIS — M1611 Unilateral primary osteoarthritis, right hip: Secondary | ICD-10-CM | POA: Diagnosis not present

## 2016-03-30 MED ORDER — TRIAMCINOLONE ACETONIDE 40 MG/ML IJ SUSP
80.0000 mg | INTRAMUSCULAR | Status: AC | PRN
  Administered 2016-03-30: 80 mg via INTRA_ARTICULAR

## 2016-03-30 MED ORDER — LIDOCAINE HCL 2 % IJ SOLN
4.0000 mL | INTRAMUSCULAR | Status: AC | PRN
  Administered 2016-03-30: 4 mL

## 2016-03-30 NOTE — Progress Notes (Signed)
Alexa Carson - 70 y.o. female MRN [id number]  Date of birth: 09/25/1945  Office Visit Note: Visit Date: 03/30/2016 PCP: PROVIDER NOT IN SYSTEM Referred by: No ref. provider found  Subjective: Chief Complaint  Patient presents with  . Right Hip - Pain   HPI: Alexa Carson is a very pleasant 70 year old female. She is the wife of Kerry KassJames E off-and-on for his back. She has pretty significant hip osteoarthritis and pain into groin. We are included diagnostic and hopefully therapeutic right hip anesthetic arthrogram.    Right hip/ groin pain. Pain present for months. Difficulty walking and getting up.No dye allergy. ROS Otherwise per HPI.  Assessment & Plan: Visit Diagnoses:  1. Pain in right hip   2. Unilateral primary osteoarthritis, right hip     Plan: No additional findings.   Meds & Orders: No orders of the defined types were placed in this encounter.   Orders Placed This Encounter  Procedures  . Large Joint Injection/Arthrocentesis    Follow-up: No Follow-up on file.   Procedures: Hip anesthetic arthrogram Date/Time: 03/30/2016 8:30 AM Performed by: Tyrell AntonioNEWTON, Dorin Stooksbury Authorized by: Tyrell AntonioNEWTON, Samule Life   Consent Given by:  Patient Site marked: the procedure site was marked   Timeout: prior to procedure the correct patient, procedure, and site was verified   Indications:  Pain Location:  Hip Site:  R hip joint Prep: patient was prepped and draped in usual sterile fashion   Needle Size:  22 G Needle Length:  3.5 inches Approach:  Anterior Ultrasound Guidance: No   Fluoroscopic Guidance: No   Arthrogram: Yes   Medications:  4 mL lidocaine 2 %; 80 mg triamcinolone acetonide 40 MG/ML Aspiration Attempted: Yes   Patient tolerance:  Patient tolerated the procedure well with no immediate complications  Arthrogram demonstrated excellent flow of contrast throughout the joint surface without extravasation or obvious defect.  The patient had relief of symptoms during the anesthetic  phase of the injection.       No notes on file   Clinical History: No specialty comments available.  She reports that she has never smoked. She has never used smokeless tobacco. No results for input(s): HGBA1C, LABURIC in the last 8760 hours.  Objective:  VS:  HT:    WT:   BMI:     BP:110/72  HR:67bpm  TEMP: ( )  RESP:  Physical Exam  Musculoskeletal:  Patient has significant pain with internal rotation of the right hip. This is in the groin.    Ortho Exam Imaging: No results found.  Past Medical/Family/Surgical/Social History: Medications & Allergies reviewed per EMR There are no active problems to display for this patient.  Past Medical History:  Diagnosis Date  . Arthritis   . Asthma   . History of kidney stones   . Hypertension   . Pneumonia    child  . Sleep apnea    cpap   History reviewed. No pertinent family history. Past Surgical History:  Procedure Laterality Date  . ABDOMINAL HYSTERECTOMY  82  . BLADDER NECK SUSPENSION  82  . BREAST SURGERY  93   augmentation  . CHOLECYSTECTOMY  70  . JOINT REPLACEMENT  12   left knee  . SHOULDER ARTHROSCOPY WITH ROTATOR CUFF REPAIR Left 08/22/2013   Procedure: SHOULDER ARTHROSCOPY with debridement WITH Open ROTATOR CUFF REPAIR;  Surgeon: Cammy CopaGregory Scott Dean, MD;  [facility];  Service: Orthopedics;  Laterality: Left;  . TOTAL KNEE ARTHROPLASTY Right 04/04/2013   Procedure: TOTAL KNEE ARTHROPLASTY;  Surgeon: Cammy CopaGregory Scott Dean, MD;  [facility];  Service: Orthopedics;  Laterality: Right;   Social History   Occupational History  . Not on file.   Social History Main Topics  . Smoking status: Never Smoker  . Smokeless tobacco: Never Used  . Alcohol use No  . Drug use: No  . Sexual activity: Not on file

## 2016-03-30 NOTE — Patient Instructions (Addendum)

## 2016-04-23 ENCOUNTER — Telehealth (INDEPENDENT_AMBULATORY_CARE_PROVIDER_SITE_OTHER): Payer: Self-pay | Admitting: *Deleted

## 2016-04-23 NOTE — Telephone Encounter (Signed)
Alexa Carson called stating she is faxing something over for Dr. August Saucerean signature and just wanted to leave a message stating this was being faxed

## 2016-05-22 ENCOUNTER — Telehealth (INDEPENDENT_AMBULATORY_CARE_PROVIDER_SITE_OTHER): Payer: Self-pay | Admitting: Physical Medicine and Rehabilitation

## 2016-05-22 NOTE — Telephone Encounter (Signed)
Patient scheduled for 05/28/16 at 145.

## 2016-05-22 NOTE — Telephone Encounter (Signed)
If the injection helped more than 50% for a good length of time that okay to repeat 1 that would need follow-up with Dr. August Saucerean afterwards. If it didn't help very much at all she really needs to follow-up with Dr. August Saucerean.

## 2016-05-28 ENCOUNTER — Encounter (INDEPENDENT_AMBULATORY_CARE_PROVIDER_SITE_OTHER): Payer: Self-pay | Admitting: Physical Medicine and Rehabilitation

## 2016-05-28 ENCOUNTER — Ambulatory Visit (INDEPENDENT_AMBULATORY_CARE_PROVIDER_SITE_OTHER): Payer: Medicare Other | Admitting: Physical Medicine and Rehabilitation

## 2016-05-28 VITALS — BP 145/74 | HR 88 | Temp 97.8°F

## 2016-05-28 DIAGNOSIS — M25551 Pain in right hip: Secondary | ICD-10-CM

## 2016-05-28 NOTE — Progress Notes (Signed)
Office Visit Note   [name]           Date of Birth: 09-27-1945           MRN: [id number] Visit Date: 05/28/2016              Requested by: No referring provider defined for this encounter. PCP: PROVIDER NOT IN SYSTEM   Assessment & Plan: Visit Diagnoses:  1. Right hip pain     Plan: Repeat right intra-articular hip injection fluoroscopic guidance. First injection lasted several months.  Follow-Up Instructions: Return if symptoms worsen or fail to improve.   Orders:  Orders Placed This Encounter  Procedures  . Large Joint Injection/Arthrocentesis   No orders of the defined types were placed in this encounter.     Procedures: Hip intra-articular injection fluoroscopic guidance Date/Time: 05/28/2016 2:08 PM Performed by: Tyrell Antonio Authorized by: Tyrell Antonio   Consent Given by:  Patient Site marked: the procedure site was marked   Timeout: prior to procedure the correct patient, procedure, and site was verified   Indications:  Pain and diagnostic evaluation Location:  Hip Site:  R hip joint Prep: patient was prepped and draped in usual sterile fashion   Needle Size:  22 G Needle length (in): 5 inches. Approach:  Anterior Ultrasound Guidance: No   Fluoroscopic Guidance: Yes   Arthrogram: No   Medications:  4 mL lidocaine 2 %; 80 mg triamcinolone acetonide 40 MG/ML Aspiration Attempted: Yes   Patient tolerance:  Patient tolerated the procedure well with no immediate complications  There was excellent flow of contrast producing a partial arthrogram of the hip. The patient did have relief of her symptoms during the anesthetic phase of the injection.     Clinical Data: No additional findings.   Subjective: Chief Complaint  Patient presents with  . Right Hip - Pain    Alexa Carson has fairly significant osteoarthritis of the right hip. She states was doing well with the first injection, and now feeling pain again. She's had no new injury  just increase in hip and groin pain. She was very happy with the results of symptoms recently. Depending on her relief and have her follow up with the orthopedic surgeon.    Review of Systems   Objective: Vital Signs: BP (!) 145/74   Pulse 88   Temp 97.8 F (36.6 C) (Oral)   SpO2 100%   Physical Exam  Musculoskeletal:  Patient ambulates with a cane with an antalgic gait to the right. Mild Trendelenburg gait.    Ortho Exam  Specialty Comments:  No specialty comments available.  Imaging: No results found.   PMFS History: There are no active problems to display for this patient.  Past Medical History:  Diagnosis Date  . Arthritis   . Asthma   . History of kidney stones   . Hypertension   . Pneumonia    child  . Sleep apnea    cpap    History reviewed. No pertinent family history.  Past Surgical History:  Procedure Laterality Date  . ABDOMINAL HYSTERECTOMY  82  . BLADDER NECK SUSPENSION  82  . BREAST SURGERY  93   augmentation  . CHOLECYSTECTOMY  70  . JOINT REPLACEMENT  12   left knee  . SHOULDER ARTHROSCOPY WITH ROTATOR CUFF REPAIR Left 08/22/2013   Procedure: SHOULDER ARTHROSCOPY with debridement WITH Open ROTATOR CUFF REPAIR;  Surgeon: Cammy Copa, MD;  Location: [facility] OR;  Service: Orthopedics;  Laterality: Left;  . TOTAL KNEE ARTHROPLASTY Right 04/04/2013   Procedure: TOTAL KNEE ARTHROPLASTY;  Surgeon: Cammy CopaGregory Scott Dean, MD;  [facility];  Service: Orthopedics;  Laterality: Right;   Social History   Occupational History  . Not on file.   Social History Main Topics  . Smoking status: Never Smoker  . Smokeless tobacco: Never Used  . Alcohol use No  . Drug use: No  . Sexual activity: Not on file

## 2016-05-28 NOTE — Patient Instructions (Signed)

## 2016-05-29 ENCOUNTER — Encounter (INDEPENDENT_AMBULATORY_CARE_PROVIDER_SITE_OTHER): Payer: Self-pay | Admitting: Physical Medicine and Rehabilitation

## 2016-05-29 MED ORDER — LIDOCAINE HCL 2 % IJ SOLN
4.0000 mL | INTRAMUSCULAR | Status: AC | PRN
  Administered 2016-05-28: 4 mL

## 2016-05-29 MED ORDER — TRIAMCINOLONE ACETONIDE 40 MG/ML IJ SUSP
80.0000 mg | INTRAMUSCULAR | Status: AC | PRN
  Administered 2016-05-28: 80 mg via INTRA_ARTICULAR

## 2016-07-22 ENCOUNTER — Ambulatory Visit (INDEPENDENT_AMBULATORY_CARE_PROVIDER_SITE_OTHER): Payer: Medicare Other

## 2016-07-22 ENCOUNTER — Encounter (INDEPENDENT_AMBULATORY_CARE_PROVIDER_SITE_OTHER): Payer: Self-pay | Admitting: Orthopedic Surgery

## 2016-07-22 ENCOUNTER — Ambulatory Visit (INDEPENDENT_AMBULATORY_CARE_PROVIDER_SITE_OTHER): Payer: Medicare Other | Admitting: Orthopedic Surgery

## 2016-07-22 DIAGNOSIS — M25561 Pain in right knee: Secondary | ICD-10-CM | POA: Diagnosis not present

## 2016-07-22 DIAGNOSIS — G8929 Other chronic pain: Secondary | ICD-10-CM | POA: Diagnosis not present

## 2016-07-22 DIAGNOSIS — M25551 Pain in right hip: Secondary | ICD-10-CM | POA: Diagnosis not present

## 2016-07-22 NOTE — Progress Notes (Signed)
Office Visit Note   [name]           Date of Birth: 01/31/1946           MRN: [id number] Visit Date: 07/22/2016 Requested by: No referring provider defined for this encounter. PCP: PROVIDER NOT IN SYSTEM  Subjective: Chief Complaint  Patient presents with  . Right Knee - Pain  . Right Hip - Pain    HPI: Alexa Carson is a 71 year old patient with right hip and right knee pain.  She has a known she denies any low back pain.  She had an injection with Dr. Alvester MorinNewton on January 2018.  She sleeps with a pillow between her knees at night.  She's taking Celebrex for her pain.  She states that her knee will occasionally buckle.  Her A1c is improved.  She has tried Aspercreme for the problem.  She does exercise and walks in the silver sneakers program.hip arthritis.  She states that the knee has been bothering her some on the right-hand side on the lateral aspect.  She getst occasional electric shocks.              ROS: All systems reviewed are negative as they relate to the chief complaint within the history of present illness.  Patient denies  fevers or chills.   Assessment & Plan: Visit Diagnoses:  1. Chronic pain of right knee   2. Pain in right hip     Plan: impression is right knee pain which is structurally difficult to lay a definitive diagnosis upon.  No effusion radiographs look normal.  No evidence of patellar instability.  I think that there is nothing really technically to "do" about this other than just watch and observe.  He does not appear infected or loose.  The tibial pain could represent early stress reaction but nothing is seen on radiographs.  I would watch that for now.  She'll follow-up as needed.  Follow-Up Instructions: No Follow-up on file.   Orders:  Orders Placed This Encounter  Procedures  . XR Knee 1-2 Views Right   No orders of the defined types were placed in this encounter.     Procedures: No procedures performed   Clinical Data: No additional  findings.  Objective: Vital Signs: There were no vitals taken for this visit.  Physical Exam:   Constitutional: Patient appears well-developed HEENT:  Head: Normocephalic Eyes:EOM are normal Neck: Normal range of motion Cardiovascular: Normal rate Pulmonary/chest: Effort normal Neurologic: Patient is alert Skin: Skin is warm Psychiatric: Patient has normal mood and affect    Ortho Exam: orthopedic examination demonstrates some groin pain with internal/external rotation on the right-hand side no nerve root tension signs.  No effusion or warmth in the right knee.  She has excellent range of motion from full extension to about 115 of flexion.  Collaterals are stable.  No patellar apprehension is present.  She has a little bit of tenderness around the lateral aspect of the patella but otherwise exam is normal.  Specialty Comments:  No specialty comments available.  Imaging: Xr Knee 1-2 Views Right  Result Date: 07/22/2016 AP lateral right knee reviewed.  Total knee prosthesis in good position.  Bone cement interface intact.  No complicating features.  No evidence of loosening.  Bone quality otherwise reasonable.    PMFS History: Patient Active Problem List   Diagnosis Date Noted  . Chronic pain of right knee 07/22/2016  . Pain in right hip 07/22/2016   Past  Medical History:  Diagnosis Date  . Arthritis   . Asthma   . History of kidney stones   . Hypertension   . Pneumonia    child  . Sleep apnea    cpap    No family history on file.  Past Surgical History:  Procedure Laterality Date  . ABDOMINAL HYSTERECTOMY  82  . BLADDER NECK SUSPENSION  82  . BREAST SURGERY  93   augmentation  . CHOLECYSTECTOMY  70  . JOINT REPLACEMENT  12   left knee  . SHOULDER ARTHROSCOPY WITH ROTATOR CUFF REPAIR Left 08/22/2013   Procedure: SHOULDER ARTHROSCOPY with debridement WITH Open ROTATOR CUFF REPAIR;  Surgeon: Cammy Copa, MD;  Location: [facility] OR;  Service: Orthopedics;   Laterality: Left;  . TOTAL KNEE ARTHROPLASTY Right 04/04/2013   Procedure: TOTAL KNEE ARTHROPLASTY;  Surgeon: Cammy Copa, MD;  Location: [facility] OR;  Service: Orthopedics;  Laterality: Right;   Social History   Occupational History  . Not on file.   Social History Main Topics  . Smoking status: Never Smoker  . Smokeless tobacco: Never Used  . Alcohol use No  . Drug use: No  . Sexual activity: Not on file

## 2016-08-10 ENCOUNTER — Telehealth (INDEPENDENT_AMBULATORY_CARE_PROVIDER_SITE_OTHER): Payer: Self-pay | Admitting: Physical Medicine and Rehabilitation

## 2016-08-10 NOTE — Telephone Encounter (Signed)
Scheduled for 08/24/16 at 1615.

## 2016-08-10 NOTE — Telephone Encounter (Signed)
Okay if it helped. 

## 2016-08-13 ENCOUNTER — Telehealth (INDEPENDENT_AMBULATORY_CARE_PROVIDER_SITE_OTHER): Payer: Self-pay | Admitting: *Deleted

## 2016-08-13 NOTE — Telephone Encounter (Signed)
Patient called in this morning in regards to wanting to go ahead and set up for surgery. She is in severe pain and would like to go ahead and get the ball rolling to get surgery on her right hip. Her CB # [phone]. She wasn't sure if she needed to see Dr. August Saucerean again or not for this because she was seen two weeks ago and it was discussed but she wasn't ready at that time but now she said she is ready for some relief. Thank you

## 2016-08-13 NOTE — Telephone Encounter (Signed)
Patient wishes to proceed with surgery on her right hip. Please fill out blue sheet so I can give to Taren.

## 2016-08-18 NOTE — Telephone Encounter (Signed)
I need to eval her skin first with rov pls clal thx

## 2016-08-18 NOTE — Telephone Encounter (Signed)
I called patient and scheduled her an appointment to discuss surgery with Dr August Saucer.

## 2016-08-20 ENCOUNTER — Ambulatory Visit (INDEPENDENT_AMBULATORY_CARE_PROVIDER_SITE_OTHER): Payer: Medicare Other | Admitting: Orthopedic Surgery

## 2016-08-20 ENCOUNTER — Other Ambulatory Visit (INDEPENDENT_AMBULATORY_CARE_PROVIDER_SITE_OTHER): Payer: Self-pay | Admitting: Orthopedic Surgery

## 2016-08-20 ENCOUNTER — Encounter (INDEPENDENT_AMBULATORY_CARE_PROVIDER_SITE_OTHER): Payer: Self-pay | Admitting: Orthopedic Surgery

## 2016-08-20 DIAGNOSIS — M1611 Unilateral primary osteoarthritis, right hip: Secondary | ICD-10-CM

## 2016-08-20 DIAGNOSIS — M25551 Pain in right hip: Secondary | ICD-10-CM

## 2016-08-20 NOTE — Progress Notes (Signed)
Office Visit Note   [name]           Date of Birth: [birth year]           MRN: [id number] Visit Date: 08/20/2016 Requested by: No referring provider defined for this encounter. PCP: PROVIDER NOT IN SYSTEM  Subjective: Chief Complaint  Patient presents with  . Right Hip - Pain    HPI: Alexa Carson is a 71 year old patient with severe right hip pain.  Here to discuss surgical intervention.  She has a history of asthma.  Her husband is here with her as well.  She is active in the church.  She's done well with 2 total knee replacements.  She does have morbid obesity but carries most of that in the truncal region and less of a tender legs.  She reports daily pain with activity localized to the groin.  She has had good results with intra-articular cortisone injections into the hip joint but they have only lasted for a month or 2.  Pain has recurred and is becoming untenable.              ROS: All systems reviewed are negative as they relate to the chief complaint within the history of present illness.  Patient denies  fevers or chills.   Assessment & Plan: Visit Diagnoses:  1. Pain in right hip     Plan: Impression is endstage right hip arthritis and an active patient who is done well with 2 prior knee replacements.  Plan is direct anterior approach hip replacement.  I looked at the skin today and she does keep it very clean and dry.  No evidence of infection redness erythema or fungal overgrowth.  Risks and benefits of surgery discussed included but limited to infection or vessel damage dislocation potential need for more surgery as well as revision in her lifetime.  All questions answered.  Follow-Up Instructions: No Follow-up on file.   Orders:  No orders of the defined types were placed in this encounter.  No orders of the defined types were placed in this encounter.     Procedures: No procedures performed   Clinical Data: No additional  findings.  Objective: Vital Signs: There were no vitals taken for this visit.  Physical Exam:   Constitutional: Patient appears well-developed HEENT:  Head: Normocephalic Eyes:EOM are normal Neck: Normal range of motion Cardiovascular: Normal rate Pulmonary/chest: Effort normal Neurologic: Patient is alert Skin: Skin is warm Psychiatric: Patient has normal mood and affect    Ortho Exam: Orthopedic exam demonstrates full active and passive range of motion of the left hip but she does have pain with internal rotation on the right hip.  Pretty good hip flexion strength.  Skin is intact in this crease below her pannus.  In general the pannus would have to be taped back but adequate exposure to the hip could be achieved.  I examined the patient lying supine on the table.  Motor sensory function to the foot is intact.  Leg lengths appeared equal on exam and on standing radiographs.  Specialty Comments:  No specialty comments available.  Imaging: No results found.   PMFS History: Patient Active Problem List   Diagnosis Date Noted  . Chronic pain of right knee 07/22/2016  . Pain in right hip 07/22/2016   Past Medical History:  Diagnosis Date  . Arthritis   . Asthma   . History of kidney stones   . Hypertension   . Pneumonia  child  . Sleep apnea    cpap    No family history on file.  Past Surgical History:  Procedure Laterality Date  . ABDOMINAL HYSTERECTOMY  82  . BLADDER NECK SUSPENSION  82  . BREAST SURGERY  93   augmentation  . CHOLECYSTECTOMY  70  . JOINT REPLACEMENT  12   left knee  . SHOULDER ARTHROSCOPY WITH ROTATOR CUFF REPAIR Left 08/22/2013   Procedure: SHOULDER ARTHROSCOPY with debridement WITH Open ROTATOR CUFF REPAIR;  Surgeon: Cammy Copa, MD;  Location: [facility] OR;  Service: Orthopedics;  Laterality: Left;  . TOTAL KNEE ARTHROPLASTY Right 04/04/2013   Procedure: TOTAL KNEE ARTHROPLASTY;  Surgeon: Cammy Copa, MD;  Location: [facility] OR;  Service:  Orthopedics;  Laterality: Right;   Social History   Occupational History  . Not on file.   Social History Main Topics  . Smoking status: Never Smoker  . Smokeless tobacco: Never Used  . Alcohol use No  . Drug use: No  . Sexual activity: Not on file

## 2016-08-24 ENCOUNTER — Ambulatory Visit (INDEPENDENT_AMBULATORY_CARE_PROVIDER_SITE_OTHER): Payer: Medicare Other | Admitting: Physical Medicine and Rehabilitation

## 2016-08-31 ENCOUNTER — Encounter (HOSPITAL_COMMUNITY)
Admission: RE | Admit: 2016-08-31 | Discharge: 2016-08-31 | Disposition: A | Discharge status: Home / Self Care | Payer: Medicare Other | Source: Ambulatory Visit | Attending: Orthopedic Surgery | Admitting: Orthopedic Surgery

## 2016-08-31 ENCOUNTER — Encounter (HOSPITAL_COMMUNITY): Payer: Self-pay

## 2016-08-31 ENCOUNTER — Other Ambulatory Visit (INDEPENDENT_AMBULATORY_CARE_PROVIDER_SITE_OTHER): Payer: Self-pay | Admitting: Orthopedic Surgery

## 2016-08-31 DIAGNOSIS — Z419 Encounter for procedure for purposes other than remedying health state, unspecified: Secondary | ICD-10-CM

## 2016-08-31 DIAGNOSIS — Z01818 Encounter for other preprocedural examination: Secondary | ICD-10-CM | POA: Diagnosis present

## 2016-08-31 DIAGNOSIS — M1611 Unilateral primary osteoarthritis, right hip: Secondary | ICD-10-CM | POA: Insufficient documentation

## 2016-08-31 DIAGNOSIS — I700 Atherosclerosis of aorta: Secondary | ICD-10-CM | POA: Insufficient documentation

## 2016-08-31 LAB — BASIC METABOLIC PANEL
Anion gap: 11 (ref 5–15)
BUN: 21 mg/dL — ABNORMAL HIGH (ref 6–20)
CHLORIDE: 102 mmol/L (ref 101–111)
CO2: 26 mmol/L (ref 22–32)
Calcium: 10.4 mg/dL — ABNORMAL HIGH (ref 8.9–10.3)
Creatinine, Ser: 0.82 mg/dL (ref 0.44–1.00)
GFR calc Af Amer: >60 mL/min (ref >60)
GFR calc non Af Amer: >60 mL/min (ref >60)
Glucose, Bld: 131 mg/dL — ABNORMAL HIGH (ref 65–99)
POTASSIUM: 4.5 mmol/L (ref 3.5–5.1)
SODIUM: 139 mmol/L (ref 135–145)

## 2016-08-31 LAB — SURGICAL PCR SCREEN
MRSA, PCR: NEGATIVE
Staphylococcus aureus: NEGATIVE

## 2016-08-31 LAB — URINALYSIS, ROUTINE W REFLEX MICROSCOPIC
Bilirubin Urine: NEGATIVE
GLUCOSE, UA: NEGATIVE mg/dL
Hgb urine dipstick: NEGATIVE
Ketones, ur: NEGATIVE mg/dL
LEUKOCYTES UA: NEGATIVE
Nitrite: NEGATIVE
PH: 5 (ref 5.0–8.0)
Protein, ur: NEGATIVE mg/dL
SPECIFIC GRAVITY, URINE: 1.012 (ref 1.005–1.030)

## 2016-08-31 LAB — CBC
HEMATOCRIT: 38.3 % (ref 36.0–46.0)
Hemoglobin: 12.5 g/dL (ref 12.0–15.0)
MCH: 29.8 pg (ref 26.0–34.0)
MCHC: 32.6 g/dL (ref 30.0–36.0)
MCV: 91.4 fL (ref 78.0–100.0)
Platelets: 420 10*3/uL — ABNORMAL HIGH (ref 150–400)
RBC: 4.19 MIL/uL (ref 3.87–5.11)
RDW: 12.5 % (ref 11.5–15.5)
WBC: 9.3 10*3/uL (ref 4.0–10.5)

## 2016-08-31 NOTE — Pre-Procedure Instructions (Signed)
DANISA KOPEC  08/31/2016      [facility] - [place], [place] - [address] [place] [address] Phone: [phone] Fax: [phone]  [facility] [address] [place], [place] - [address] [place] [address] Phone: [phone] Fax: [phone]    Your procedure is scheduled on    Friday  09/04/16  Report to [facility] Admitting at 530 A.M.  Call this number if you have problems the morning of surgery:  [phone]   Remember:  Do not eat food or drink liquids after midnight.  Take these medicines the morning of surgery with A SIP OF WATER   ALBUTEROL, SINGULAIR, OXYCODONE IF NEEDED   (STOP NOW TAKING ASPIRIN OR ASPIRIN PRODUCTS, IBUPROFEN/ ADVIL/ MOTRIN,  CELECOXIB/CELEBREX, NAPROXEN/ ANAPROX, TURMERIC, HERBAL MEDICINES)   Do not wear jewelry, make-up or nail polish.  Do not wear lotions, powders, or perfumes, or deoderant.  Do not shave 48 hours prior to surgery.  Men may shave face and neck.  Do not bring valuables to the hospital.  [facility] is not responsible for any belongings or valuables.  Contacts, dentures or bridgework may not be worn into surgery.  Leave your suitcase in the car.  After surgery it may be brought to your room.  For patients admitted to the hospital, discharge time will be determined by your treatment team.  Patients discharged the day of surgery will not be allowed to drive home.   Name and phone number of your driver:    Special instructions:  [place] - Preparing for Surgery  Before surgery, you can play an important role.  Because skin is not sterile, your skin needs to be as free of germs as possible.  You can reduce the number of germs on you skin by washing with CHG (chlorahexidine gluconate) soap before surgery.  CHG is an antiseptic cleaner which kills germs and bonds with the skin to continue killing germs even after washing.  Please DO NOT  use if you have an allergy to CHG or antibacterial soaps.  If your skin becomes reddened/irritated stop using the CHG and inform your nurse when you arrive at Short Stay.  Do not shave (including legs and underarms) for at least 48 hours prior to the first CHG shower.  You may shave your face.  Please follow these instructions carefully:   1.  Shower with CHG Soap the night before surgery and the                                morning of Surgery.  2.  If you choose to wash your hair, wash your hair first as usual with your       normal shampoo.  3.  After you shampoo, rinse your hair and body thoroughly to remove the                      Shampoo.  4.  Use CHG as you would any other liquid soap.  You can apply chg directly       to the skin and wash gently with scrungie or a clean washcloth.  5.  Apply the CHG Soap to your body ONLY FROM THE NECK DOWN.        Do not use on open wounds or open sores.  Avoid contact with your eyes,       ears, mouth and genitals (private parts).  Wash genitals (private parts)       with your normal soap.  6.  Wash thoroughly, paying special attention to the area where your surgery        will be performed.  7.  Thoroughly rinse your body with warm water from the neck down.  8.  DO NOT shower/wash with your normal soap after using and rinsing off       the CHG Soap.  9.  Pat yourself dry with a clean towel.            10.  Wear clean pajamas.            11.  Place clean sheets on your bed the night of your first shower and do not        sleep with pets.  Day of Surgery  Do not apply any lotions/deoderants the morning of surgery.  Please wear clean clothes to the hospital/surgery center.    Please read over the following fact sheets that you were given. MRSA Information and Surgical Site Infection Prevention

## 2016-08-31 NOTE — Progress Notes (Signed)
REQUESTED STRESS TEST, EKG, ECHO, SLEEP STUDY, OV FROM [facility]

## 2016-09-01 LAB — URINE CULTURE: Culture: NO GROWTH

## 2016-09-01 NOTE — Progress Notes (Signed)
Anesthesia Chart Review:  Pt is a 71 year old female scheduled for R total hip arthroplasty anterior approach on 09/04/2016 with Cammy Copa, MD  - Receives primary care and cardiology care at [facility]. Last medical office visit 08/19/16 with Bethanie Dicker, M.D. who is aware of upcoming surgery. Last cardiology office visit 07/24/16 with Lollie Marrow, MD.   PMH includes:  HTN, OSA, asthma. Never smoker. BMI 56. S/p L shoulder arthrscopy 08/22/13. S/p R TKA 04/04/13.   Medications include: Albuterol, Lipitor, Lasix, lisinopril-HCTZ  Preoperative labs reviewed.    CXR 08/31/16:  1. No radiographic evidence of acute cardiopulmonary disease. 2. Aortic atherosclerosis.  EKG 07/06/16: Sinus rhythm. Prominent R (V1) and left axis-nonspecific. Seen with pulmonary disease. 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  Echo 07/24/16 [facility]): 1. Mild aortic valve sclerosis without stenosis. 2. Tricuspid valve appears structurally normal. Mild tricuspid regurgitation. 3. No evidence of pulmonary hypertension. RV systolic pressure 32.30 mmHg 4. Normal cardiac chamber sizes and function; no pericardial effusion or intracardiac mass. Normal thoracic aorta and aortic arch.  If no changes, I anticipate pt can proceed with surgery as scheduled.   Rica Mast, FNP-BC [facility] Short Stay Surgical Center/Anesthesiology Phone: [phone] 09/01/2016 4:52 PM

## 2016-09-03 ENCOUNTER — Encounter (HOSPITAL_COMMUNITY): Payer: Self-pay | Admitting: Anesthesiology

## 2016-09-03 MED ORDER — CLINDAMYCIN PHOSPHATE 900 MG/50ML IV SOLN
900.0000 mg | INTRAVENOUS | Status: AC
  Administered 2016-09-04: 900 mg via INTRAVENOUS
  Filled 2016-09-03: qty 50

## 2016-09-03 NOTE — Anesthesia Preprocedure Evaluation (Addendum)
Anesthesia Evaluation  Patient identified by MRN, date of birth, ID band  Reviewed: Allergy & Precautions, NPO status , Patient's Chart, lab work & pertinent test results  History of Anesthesia Complications Negative for: history of anesthetic complications  Airway Mallampati: II   Neck ROM: Full    Dental  (+) Poor Dentition, Dental Advisory Given, Missing,    Pulmonary asthma , sleep apnea and Continuous Positive Airway Pressure Ventilation , pneumonia, resolved,    breath sounds clear to auscultation       Cardiovascular hypertension, Pt. on medications  Rhythm:Regular Rate:Normal     Neuro/Psych negative neurological ROS  negative psych ROS   GI/Hepatic negative GI ROS, Neg liver ROS,   Endo/Other  negative endocrine ROSMorbid obesity  Renal/GU negative Renal ROS  negative genitourinary   Musculoskeletal  (+) Arthritis , Osteoarthritis,    Abdominal (+) + obese,  Abdomen: soft. Bowel sounds: normal.  Peds negative pediatric ROS (+)  Hematology negative hematology ROS (+)   Anesthesia Other Findings Day of surgery medications reviewed with the patient.  Reproductive/Obstetrics negative OB ROS                           Lab Results  Component Value Date   WBC 9.3 08/31/2016   HGB 12.5 08/31/2016   HCT 38.3 08/31/2016   MCV 91.4 08/31/2016   PLT 420 (H) 08/31/2016   Lab Results  Component Value Date   INR 1.08 04/07/2013   INR 1.11 04/06/2013   INR 0.99 04/05/2013     Anesthesia Physical Anesthesia Plan  ASA: III  Anesthesia Plan: General   Post-op Pain Management:    Induction: Intravenous  Airway Management Planned: Oral ETT  Additional Equipment:   Intra-op Plan:   Post-operative Plan: Extubation in OR  Informed Consent: I have reviewed the patients History and Physical, chart, labs and discussed the procedure including the risks, benefits and alternatives for  the proposed anesthesia with the patient or authorized representative who has indicated his/her understanding and acceptance.   Dental advisory given  Plan Discussed with: CRNA  Anesthesia Plan Comments: (Decided against spinal due to inconsistency (level) of 0.5 Bupi (plain) in morbidly obese population.  )       Anesthesia Quick Evaluation

## 2016-09-04 ENCOUNTER — Encounter (HOSPITAL_COMMUNITY): Payer: Self-pay | Admitting: Urology

## 2016-09-04 ENCOUNTER — Inpatient Hospital Stay (HOSPITAL_COMMUNITY): Payer: Medicare Other

## 2016-09-04 ENCOUNTER — Inpatient Hospital Stay (HOSPITAL_COMMUNITY): Payer: Medicare Other | Admitting: Anesthesiology

## 2016-09-04 ENCOUNTER — Inpatient Hospital Stay (HOSPITAL_COMMUNITY)
Admission: RE | Admit: 2016-09-04 | Discharge: 2016-09-07 | DRG: 470 | Disposition: A | Discharge status: Home Health | Payer: Medicare Other | Source: Ambulatory Visit | Attending: Orthopedic Surgery | Admitting: Orthopedic Surgery

## 2016-09-04 ENCOUNTER — Encounter (HOSPITAL_COMMUNITY)
Admission: RE | Disposition: A | Discharge status: Home Health | Payer: Self-pay | Source: Ambulatory Visit | Attending: Orthopedic Surgery

## 2016-09-04 ENCOUNTER — Inpatient Hospital Stay (HOSPITAL_COMMUNITY): Payer: Medicare Other | Admitting: Emergency Medicine

## 2016-09-04 DIAGNOSIS — Z885 Allergy status to narcotic agent status: Secondary | ICD-10-CM

## 2016-09-04 DIAGNOSIS — J45909 Unspecified asthma, uncomplicated: Secondary | ICD-10-CM | POA: Diagnosis present

## 2016-09-04 DIAGNOSIS — M1610 Unilateral primary osteoarthritis, unspecified hip: Secondary | ICD-10-CM | POA: Diagnosis present

## 2016-09-04 DIAGNOSIS — Z9049 Acquired absence of other specified parts of digestive tract: Secondary | ICD-10-CM | POA: Diagnosis not present

## 2016-09-04 DIAGNOSIS — Z882 Allergy status to sulfonamides status: Secondary | ICD-10-CM

## 2016-09-04 DIAGNOSIS — Z880 Allergy status to penicillin: Secondary | ICD-10-CM | POA: Diagnosis not present

## 2016-09-04 DIAGNOSIS — Z881 Allergy status to other antibiotic agents status: Secondary | ICD-10-CM

## 2016-09-04 DIAGNOSIS — Z6841 Body Mass Index (BMI) 40.0 and over, adult: Secondary | ICD-10-CM | POA: Diagnosis not present

## 2016-09-04 DIAGNOSIS — Z91040 Latex allergy status: Secondary | ICD-10-CM

## 2016-09-04 DIAGNOSIS — E662 Morbid (severe) obesity with alveolar hypoventilation: Secondary | ICD-10-CM | POA: Diagnosis present

## 2016-09-04 DIAGNOSIS — Z90710 Acquired absence of both cervix and uterus: Secondary | ICD-10-CM

## 2016-09-04 DIAGNOSIS — Z7983 Long term (current) use of bisphosphonates: Secondary | ICD-10-CM

## 2016-09-04 DIAGNOSIS — I10 Essential (primary) hypertension: Secondary | ICD-10-CM | POA: Diagnosis present

## 2016-09-04 DIAGNOSIS — M25551 Pain in right hip: Secondary | ICD-10-CM | POA: Diagnosis present

## 2016-09-04 DIAGNOSIS — Z96653 Presence of artificial knee joint, bilateral: Secondary | ICD-10-CM | POA: Diagnosis present

## 2016-09-04 DIAGNOSIS — M1611 Unilateral primary osteoarthritis, right hip: Secondary | ICD-10-CM | POA: Diagnosis not present

## 2016-09-04 DIAGNOSIS — M25569 Pain in unspecified knee: Secondary | ICD-10-CM

## 2016-09-04 DIAGNOSIS — Z419 Encounter for procedure for purposes other than remedying health state, unspecified: Secondary | ICD-10-CM

## 2016-09-04 HISTORY — PX: TOTAL HIP ARTHROPLASTY: SHX124

## 2016-09-04 SURGERY — ARTHROPLASTY, HIP, TOTAL, ANTERIOR APPROACH
Anesthesia: General | Site: Hip | Laterality: Right

## 2016-09-04 MED ORDER — ROCURONIUM BROMIDE 10 MG/ML (PF) SYRINGE
PREFILLED_SYRINGE | INTRAVENOUS | Status: AC
  Filled 2016-09-04: qty 5

## 2016-09-04 MED ORDER — HYDROCHLOROTHIAZIDE 12.5 MG PO CAPS
12.5000 mg | ORAL_CAPSULE | Freq: Every day | ORAL | Status: DC
  Administered 2016-09-05 – 2016-09-07 (×2): 12.5 mg via ORAL
  Filled 2016-09-04 (×3): qty 1

## 2016-09-04 MED ORDER — PROPOFOL 10 MG/ML IV BOLUS
INTRAVENOUS | Status: AC
  Filled 2016-09-04: qty 20

## 2016-09-04 MED ORDER — 0.9 % SODIUM CHLORIDE (POUR BTL) OPTIME
TOPICAL | Status: DC | PRN
  Administered 2016-09-04: 1000 mL

## 2016-09-04 MED ORDER — MIDAZOLAM HCL 2 MG/2ML IJ SOLN
INTRAMUSCULAR | Status: AC
  Filled 2016-09-04: qty 2

## 2016-09-04 MED ORDER — SUCCINYLCHOLINE CHLORIDE 20 MG/ML IJ SOLN
INTRAMUSCULAR | Status: DC | PRN
  Administered 2016-09-04: 120 mg via INTRAVENOUS

## 2016-09-04 MED ORDER — KETAMINE HCL 10 MG/ML IJ SOLN
INTRAMUSCULAR | Status: DC | PRN
  Administered 2016-09-04 (×2): 30 mg via INTRAVENOUS
  Administered 2016-09-04 (×2): 20 mg via INTRAVENOUS

## 2016-09-04 MED ORDER — SUGAMMADEX SODIUM 200 MG/2ML IV SOLN
INTRAVENOUS | Status: AC
  Filled 2016-09-04: qty 2

## 2016-09-04 MED ORDER — ONDANSETRON HCL 4 MG/2ML IJ SOLN
INTRAMUSCULAR | Status: DC | PRN
Start: 2016-09-04 — End: 2016-09-04
  Administered 2016-09-04 (×2): 4 mg via INTRAVENOUS

## 2016-09-04 MED ORDER — ATORVASTATIN CALCIUM 10 MG PO TABS
10.0000 mg | ORAL_TABLET | Freq: Every day | ORAL | Status: DC
  Administered 2016-09-05 – 2016-09-06 (×2): 10 mg via ORAL
  Filled 2016-09-04 (×2): qty 1

## 2016-09-04 MED ORDER — TRANEXAMIC ACID 1000 MG/10ML IV SOLN
2000.0000 mg | Freq: Once | INTRAVENOUS | Status: AC
  Administered 2016-09-04: 2000 mg via TOPICAL
  Filled 2016-09-04: qty 20

## 2016-09-04 MED ORDER — RIVAROXABAN 10 MG PO TABS
10.0000 mg | ORAL_TABLET | Freq: Every day | ORAL | Status: DC
  Administered 2016-09-05 – 2016-09-07 (×3): 10 mg via ORAL
  Filled 2016-09-04 (×3): qty 1

## 2016-09-04 MED ORDER — DEXAMETHASONE SODIUM PHOSPHATE 10 MG/ML IJ SOLN
INTRAMUSCULAR | Status: DC | PRN
  Administered 2016-09-04: 10 mg via INTRAVENOUS

## 2016-09-04 MED ORDER — OXYCODONE HCL 5 MG PO TABS
ORAL_TABLET | ORAL | Status: AC
  Filled 2016-09-04: qty 1

## 2016-09-04 MED ORDER — MIDAZOLAM HCL 2 MG/2ML IJ SOLN
INTRAMUSCULAR | Status: DC | PRN
Start: 2016-09-04 — End: 2016-09-04
  Administered 2016-09-04 (×2): 2 mg via INTRAVENOUS

## 2016-09-04 MED ORDER — CHLORHEXIDINE GLUCONATE 4 % EX LIQD: 60.0000 mL | Freq: Once | CUTANEOUS | Status: DC

## 2016-09-04 MED ORDER — LACTATED RINGERS IV SOLN
INTRAVENOUS | Status: DC
  Administered 2016-09-04: 14:00:00 via INTRAVENOUS

## 2016-09-04 MED ORDER — KETAMINE HCL-SODIUM CHLORIDE 100-0.9 MG/10ML-% IV SOSY
PREFILLED_SYRINGE | INTRAVENOUS | Status: AC
  Filled 2016-09-04: qty 10

## 2016-09-04 MED ORDER — DEXAMETHASONE SODIUM PHOSPHATE 10 MG/ML IJ SOLN
INTRAMUSCULAR | Status: AC
  Filled 2016-09-04: qty 1

## 2016-09-04 MED ORDER — VANCOMYCIN HCL IN DEXTROSE 1-5 GM/200ML-% IV SOLN
1000.0000 mg | Freq: Two times a day (BID) | INTRAVENOUS | Status: AC
  Administered 2016-09-04: 1000 mg via INTRAVENOUS
  Filled 2016-09-04: qty 200

## 2016-09-04 MED ORDER — FENTANYL CITRATE (PF) 250 MCG/5ML IJ SOLN
INTRAMUSCULAR | Status: AC
  Filled 2016-09-04: qty 5

## 2016-09-04 MED ORDER — FENTANYL CITRATE (PF) 250 MCG/5ML IJ SOLN
INTRAMUSCULAR | Status: AC
Start: 2016-09-04 — End: 2016-09-04
  Filled 2016-09-04: qty 5

## 2016-09-04 MED ORDER — MONTELUKAST SODIUM 10 MG PO TABS
10.0000 mg | ORAL_TABLET | Freq: Every day | ORAL | Status: DC
  Administered 2016-09-05 – 2016-09-07 (×3): 10 mg via ORAL
  Filled 2016-09-04 (×3): qty 1

## 2016-09-04 MED ORDER — ACETAMINOPHEN 10 MG/ML IV SOLN
INTRAVENOUS | Status: DC | PRN
  Administered 2016-09-04: 1000 mg via INTRAVENOUS

## 2016-09-04 MED ORDER — GABAPENTIN 300 MG PO CAPS
300.0000 mg | ORAL_CAPSULE | Freq: Three times a day (TID) | ORAL | Status: DC
  Administered 2016-09-04 – 2016-09-07 (×10): 300 mg via ORAL
  Filled 2016-09-04 (×10): qty 1

## 2016-09-04 MED ORDER — ONDANSETRON HCL 4 MG PO TABS
4.0000 mg | ORAL_TABLET | Freq: Four times a day (QID) | ORAL | Status: DC | PRN
  Administered 2016-09-06: 4 mg via ORAL
  Filled 2016-09-04: qty 1

## 2016-09-04 MED ORDER — TRANEXAMIC ACID 1000 MG/10ML IV SOLN
1000.0000 mg | INTRAVENOUS | Status: AC
  Administered 2016-09-04: 1000 mg via INTRAVENOUS
  Filled 2016-09-04: qty 10

## 2016-09-04 MED ORDER — ACETAMINOPHEN 650 MG RE SUPP: 650.0000 mg | Freq: Four times a day (QID) | RECTAL | Status: DC | PRN

## 2016-09-04 MED ORDER — HYDROMORPHONE HCL 1 MG/ML IJ SOLN
INTRAMUSCULAR | Status: AC
  Filled 2016-09-04: qty 0.5

## 2016-09-04 MED ORDER — METOCLOPRAMIDE HCL 5 MG/ML IJ SOLN: 5.0000 mg | Freq: Three times a day (TID) | INTRAMUSCULAR | Status: DC | PRN

## 2016-09-04 MED ORDER — METHOCARBAMOL 500 MG PO TABS
500.0000 mg | ORAL_TABLET | Freq: Four times a day (QID) | ORAL | Status: DC | PRN
  Administered 2016-09-04 – 2016-09-07 (×9): 500 mg via ORAL
  Filled 2016-09-04 (×9): qty 1

## 2016-09-04 MED ORDER — FENTANYL CITRATE (PF) 100 MCG/2ML IJ SOLN
INTRAMUSCULAR | Status: DC | PRN
  Administered 2016-09-04 (×6): 50 ug via INTRAVENOUS

## 2016-09-04 MED ORDER — MEPERIDINE HCL 25 MG/ML IJ SOLN: 6.2500 mg | INTRAMUSCULAR | Status: DC | PRN

## 2016-09-04 MED ORDER — ALBUTEROL SULFATE (2.5 MG/3ML) 0.083% IN NEBU: 2.5000 mg | INHALATION_SOLUTION | Freq: Four times a day (QID) | RESPIRATORY_TRACT | Status: DC | PRN

## 2016-09-04 MED ORDER — LIDOCAINE 2% (20 MG/ML) 5 ML SYRINGE
INTRAMUSCULAR | Status: DC | PRN
  Administered 2016-09-04: 80 mg via INTRAVENOUS

## 2016-09-04 MED ORDER — SODIUM CHLORIDE 0.9 % IR SOLN
Status: DC | PRN
  Administered 2016-09-04: 3000 mL

## 2016-09-04 MED ORDER — PHENOL 1.4 % MT LIQD: 1.0000 | OROMUCOSAL | Status: DC | PRN

## 2016-09-04 MED ORDER — METHOCARBAMOL 1000 MG/10ML IJ SOLN
500.0000 mg | Freq: Four times a day (QID) | INTRAVENOUS | Status: DC | PRN
  Filled 2016-09-04: qty 5

## 2016-09-04 MED ORDER — LIDOCAINE 2% (20 MG/ML) 5 ML SYRINGE
INTRAMUSCULAR | Status: AC
Start: 2016-09-04 — End: 2016-09-04
  Filled 2016-09-04: qty 10

## 2016-09-04 MED ORDER — ONDANSETRON HCL 4 MG/2ML IJ SOLN: 4.0000 mg | Freq: Four times a day (QID) | INTRAMUSCULAR | Status: DC | PRN

## 2016-09-04 MED ORDER — OXYCODONE HCL 5 MG PO TABS
5.0000 mg | ORAL_TABLET | ORAL | Status: DC | PRN
  Administered 2016-09-04: 5 mg via ORAL
  Administered 2016-09-05 (×5): 10 mg via ORAL
  Administered 2016-09-06: 5 mg via ORAL
  Administered 2016-09-06 (×3): 10 mg via ORAL
  Administered 2016-09-07: 5 mg via ORAL
  Administered 2016-09-07 (×2): 10 mg via ORAL
  Filled 2016-09-04 (×4): qty 2
  Filled 2016-09-04: qty 1
  Filled 2016-09-04 (×4): qty 2
  Filled 2016-09-04 (×2): qty 1
  Filled 2016-09-04 (×2): qty 2

## 2016-09-04 MED ORDER — LISINOPRIL-HYDROCHLOROTHIAZIDE 20-12.5 MG PO TABS: 1.0000 | ORAL_TABLET | Freq: Every day | ORAL | Status: DC

## 2016-09-04 MED ORDER — ONDANSETRON HCL 4 MG/2ML IJ SOLN
INTRAMUSCULAR | Status: AC
  Filled 2016-09-04: qty 2

## 2016-09-04 MED ORDER — ROCURONIUM BROMIDE 10 MG/ML (PF) SYRINGE
PREFILLED_SYRINGE | INTRAVENOUS | Status: DC | PRN
  Administered 2016-09-04: 50 mg via INTRAVENOUS

## 2016-09-04 MED ORDER — ALBUTEROL SULFATE HFA 108 (90 BASE) MCG/ACT IN AERS: 2.0000 | INHALATION_SPRAY | Freq: Four times a day (QID) | RESPIRATORY_TRACT | Status: DC | PRN

## 2016-09-04 MED ORDER — PROMETHAZINE HCL 25 MG/ML IJ SOLN
6.2500 mg | INTRAMUSCULAR | Status: DC | PRN
Start: 2016-09-04 — End: 2016-09-04

## 2016-09-04 MED ORDER — SUGAMMADEX SODIUM 200 MG/2ML IV SOLN
INTRAVENOUS | Status: DC | PRN
  Administered 2016-09-04: 200 mg via INTRAVENOUS

## 2016-09-04 MED ORDER — FUROSEMIDE 20 MG PO TABS
20.0000 mg | ORAL_TABLET | Freq: Every day | ORAL | Status: DC
  Administered 2016-09-05 – 2016-09-07 (×3): 20 mg via ORAL
  Filled 2016-09-04 (×3): qty 1

## 2016-09-04 MED ORDER — HYDROMORPHONE HCL 1 MG/ML IJ SOLN
0.2500 mg | INTRAMUSCULAR | Status: DC | PRN
  Administered 2016-09-04: 0.5 mg via INTRAVENOUS

## 2016-09-04 MED ORDER — ALENDRONATE SODIUM 70 MG PO TABS: 70.0000 mg | ORAL_TABLET | ORAL | Status: DC

## 2016-09-04 MED ORDER — ACETAMINOPHEN 325 MG PO TABS: 650.0000 mg | ORAL_TABLET | Freq: Four times a day (QID) | ORAL | Status: DC | PRN

## 2016-09-04 MED ORDER — SUCCINYLCHOLINE CHLORIDE 200 MG/10ML IV SOSY
PREFILLED_SYRINGE | INTRAVENOUS | Status: AC
  Filled 2016-09-04: qty 10

## 2016-09-04 MED ORDER — METOCLOPRAMIDE HCL 5 MG PO TABS: 5.0000 mg | ORAL_TABLET | Freq: Three times a day (TID) | ORAL | Status: DC | PRN

## 2016-09-04 MED ORDER — LISINOPRIL 20 MG PO TABS
20.0000 mg | ORAL_TABLET | Freq: Every day | ORAL | Status: DC
  Administered 2016-09-05: 20 mg via ORAL
  Filled 2016-09-04 (×3): qty 1

## 2016-09-04 MED ORDER — ACETAMINOPHEN 10 MG/ML IV SOLN
INTRAVENOUS | Status: AC
  Filled 2016-09-04: qty 100

## 2016-09-04 MED ORDER — MENTHOL 3 MG MT LOZG: 1.0000 | LOZENGE | OROMUCOSAL | Status: DC | PRN

## 2016-09-04 MED ORDER — POTASSIUM CHLORIDE IN NACL 20-0.9 MEQ/L-% IV SOLN
INTRAVENOUS | Status: AC
  Administered 2016-09-04: 18:00:00 via INTRAVENOUS
  Filled 2016-09-04: qty 1000

## 2016-09-04 MED ORDER — METHOCARBAMOL 500 MG PO TABS
ORAL_TABLET | ORAL | Status: AC
  Filled 2016-09-04: qty 1

## 2016-09-04 MED ORDER — LACTATED RINGERS IV SOLN
INTRAVENOUS | Status: DC | PRN
  Administered 2016-09-04 (×2): via INTRAVENOUS

## 2016-09-04 MED ORDER — CHOLECALCIFEROL 10 MCG (400 UNIT) PO TABS
800.0000 [IU] | ORAL_TABLET | Freq: Every day | ORAL | Status: DC
  Administered 2016-09-05 – 2016-09-07 (×3): 800 [IU] via ORAL
  Filled 2016-09-04 (×4): qty 2

## 2016-09-04 MED ORDER — MORPHINE SULFATE (PF) 4 MG/ML IV SOLN
4.0000 mg | INTRAVENOUS | Status: DC | PRN
  Administered 2016-09-07 (×2): 4 mg via INTRAVENOUS
  Filled 2016-09-04 (×2): qty 1

## 2016-09-04 SURGICAL SUPPLY — 47 items
BAG DECANTER FOR FLEXI CONT (MISCELLANEOUS) ×2 IMPLANT
CAPT HIP TOTAL 2 ×2 IMPLANT
CELLS DAT CNTRL 66122 CELL SVR (MISCELLANEOUS) ×1 IMPLANT
COVER SURGICAL LIGHT HANDLE (MISCELLANEOUS) ×2 IMPLANT
DRAPE C-ARM 42X72 X-RAY (DRAPES) ×2 IMPLANT
DRAPE STERI IOBAN 125X83 (DRAPES) ×2 IMPLANT
DRAPE U-SHAPE 47X51 STRL (DRAPES) ×4 IMPLANT
DRSG AQUACEL AG ADV 3.5X10 (GAUZE/BANDAGES/DRESSINGS) ×2 IMPLANT
DURAPREP 26ML APPLICATOR (WOUND CARE) ×2 IMPLANT
ELECT BLADE 4.0 EZ CLEAN MEGAD (MISCELLANEOUS) ×2
ELECT REM PT RETURN 9FT ADLT (ELECTROSURGICAL) ×2
ELECTRODE BLDE 4.0 EZ CLN MEGD (MISCELLANEOUS) ×1 IMPLANT
ELECTRODE REM PT RTRN 9FT ADLT (ELECTROSURGICAL) ×1 IMPLANT
GLOVE SKINSENSE NS SZ7.5 (GLOVE) ×1
GLOVE SKINSENSE STRL SZ7.5 (GLOVE) ×1 IMPLANT
GLOVE SURG SYN 7.5  E (GLOVE) ×2
GLOVE SURG SYN 7.5 E (GLOVE) ×2 IMPLANT
GOWN SRG XL XLNG 56XLVL 4 (GOWN DISPOSABLE) IMPLANT
GOWN STRL NON-REIN XL XLG LVL4 (GOWN DISPOSABLE)
GOWN STRL REUS W/ TWL LRG LVL3 (GOWN DISPOSABLE) IMPLANT
GOWN STRL REUS W/TWL LRG LVL3 (GOWN DISPOSABLE)
HANDPIECE INTERPULSE COAX TIP (DISPOSABLE) ×1
HOOD PEEL AWAY FLYTE STAYCOOL (MISCELLANEOUS) ×4 IMPLANT
IV NS IRRIG 3000ML ARTHROMATIC (IV SOLUTION) ×2 IMPLANT
KIT BASIN OR (CUSTOM PROCEDURE TRAY) ×2 IMPLANT
MARKER SKIN DUAL TIP RULER LAB (MISCELLANEOUS) ×2 IMPLANT
NS IRRIG 1000ML POUR BTL (IV SOLUTION) ×6 IMPLANT
PACK TOTAL JOINT (CUSTOM PROCEDURE TRAY) ×2 IMPLANT
PACK UNIVERSAL I (CUSTOM PROCEDURE TRAY) ×2 IMPLANT
RTRCTR WOUND ALEXIS 18CM MED (MISCELLANEOUS) ×2
SAW OSC TIP CART 19.5X105X1.3 (SAW) ×2 IMPLANT
SET HNDPC FAN SPRY TIP SCT (DISPOSABLE) ×1 IMPLANT
SPONGE LAP 18X18 X RAY DECT (DISPOSABLE) ×2 IMPLANT
STAPLER VISISTAT 35W (STAPLE) IMPLANT
SUCTION FRAZIER TIP 10 FR DISP (SUCTIONS) ×2 IMPLANT
SUT ETHIBOND 2 V 37 (SUTURE) ×2 IMPLANT
SUT ETHILON 3 0 PS 1 (SUTURE) ×10 IMPLANT
SUT VIC AB 0 CT1 27 (SUTURE) ×5
SUT VIC AB 0 CT1 27XBRD ANBCTR (SUTURE) ×5 IMPLANT
SUT VIC AB 1 CT1 27 (SUTURE) ×6
SUT VIC AB 1 CT1 27XBRD ANBCTR (SUTURE) ×6 IMPLANT
SUT VIC AB 2-0 CT1 27 (SUTURE) ×6
SUT VIC AB 2-0 CT1 TAPERPNT 27 (SUTURE) ×6 IMPLANT
TOWEL OR 17X26 10 PK STRL BLUE (TOWEL DISPOSABLE) ×2 IMPLANT
TRAY CATH 16FR W/PLASTIC CATH (SET/KITS/TRAYS/PACK) IMPLANT
TRAY FOLEY CATH SILVER 16FR LF (SET/KITS/TRAYS/PACK) ×2 IMPLANT
YANKAUER SUCT BULB TIP NO VENT (SUCTIONS) ×2 IMPLANT

## 2016-09-04 NOTE — Evaluation (Signed)
Physical Therapy Evaluation Patient Details Name: Alexa Carson MRN: [id number] DOB: Feb 22, 1946 Today's Date: 09/04/2016   History of Present Illness  Pt is a 71 yo female admitted on 09/04/16 for elective right THA. PMH significant for Right TKA, Right shoulder replacement, asthma, OA, HTN, pneumonia.   Clinical Impression  Pt is POD 0 and moving well with therapy. Prior to admission, pt lived with her husband in a single level home. Pt was independent with mobility and dealing with a lot of pain. This session, pt requires Min A for mobility including bed mobs, transfers and gait. Pt will benefit from continued acute rehab services in order to address the below deficits prior to discharge to venue recommended below.     Follow Up Recommendations Home health PT    Equipment Recommendations  None recommended by PT    Recommendations for Other Services       Precautions / Restrictions Precautions Precautions: Fall Restrictions Weight Bearing Restrictions: Yes RLE Weight Bearing: Weight bearing as tolerated      Mobility  Bed Mobility Overal bed mobility: Needs Assistance Bed Mobility: Supine to Sit     Supine to sit: Min assist;HOB elevated     General bed mobility comments: Min A with trunk and LE's to EOB. Pt does attempt to perform indepdendently.   Transfers Overall transfer level: Needs assistance Equipment used: Rolling walker (2 wheeled) Transfers: Sit to/from Stand Sit to Stand: Min assist         General transfer comment: Min A from EOB with cues for hand placement  Ambulation/Gait Ambulation/Gait assistance: Min assist Ambulation Distance (Feet): 20 Feet Assistive device: Rolling walker (2 wheeled) Gait Pattern/deviations: Step-to pattern;Decreased step length - left;Decreased stance time - right;Antalgic Gait velocity: decreased Gait velocity interpretation: Below normal speed for age/gender General Gait Details: moderate antalgic gait, cues for  sequencing and upright posture. pt becomes warm and has increased c/o pain with gait.   Stairs            Wheelchair Mobility    Modified Rankin (Stroke Patients Only)       Balance                                             Pertinent Vitals/Pain Pain Assessment: Faces Faces Pain Scale: Hurts even more Pain Location: right hip with mobility Pain Descriptors / Indicators: Grimacing;Guarding Pain Intervention(s): Monitored during session;Premedicated before session;Repositioned;Ice applied    Home Living Family/patient expects to be discharged to:: Private residence Living Arrangements: Spouse/significant other Available Help at Discharge: Family;Available 24 hours/day Type of Home: House Home Access: Stairs to enter Entrance Stairs-Rails: Right Entrance Stairs-Number of Steps: 3 Home Layout: One level Home Equipment: Walker - 2 wheels;Bedside commode;Cane - single point      Prior Function Level of Independence: Independent         Comments: completely independent prior to surgery     Hand Dominance   Dominant Hand: Right    Extremity/Trunk Assessment   Upper Extremity Assessment Upper Extremity Assessment: Defer to OT evaluation    Lower Extremity Assessment Lower Extremity Assessment: RLE deficits/detail RLE Deficits / Details: pt with normal post op pain and weakness. At least 3/5 ankle and 2/5 knee and hip per gross functional assessment.    Cervical / Trunk Assessment Cervical / Trunk Assessment: Normal  Communication   Communication: No difficulties  Cognition Arousal/Alertness: Awake/alert Behavior During Therapy: WFL for tasks assessed/performed Overall Cognitive Status: Within Functional Limits for tasks assessed                                        General Comments      Exercises Total Joint Exercises Ankle Circles/Pumps: AROM;Both;20 reps;Supine Quad Sets: AROM;Both;10 reps;Supine Gluteal  Sets: AROM;Both;10 reps;Supine   Assessment/Plan    PT Assessment Patient needs continued PT services  PT Problem List Decreased strength;Decreased activity tolerance;Decreased balance;Decreased mobility;Pain       PT Treatment Interventions DME instruction;Gait training;Stair training;Functional mobility training;Therapeutic activities;Therapeutic exercise;Balance training;Patient/family education    PT Goals (Current goals can be found in the Care Plan section)  Acute Rehab PT Goals Patient Stated Goal: to get home and get moving again PT Goal Formulation: With patient Time For Goal Achievement: 09/11/16 Potential to Achieve Goals: Good    Frequency 7X/week   Barriers to discharge        Co-evaluation               End of Session Equipment Utilized During Treatment: Gait belt Activity Tolerance: Patient tolerated treatment well Patient left: in chair;with family/visitor present;with call bell/phone within reach Nurse Communication: Mobility status;Patient requests pain meds PT Visit Diagnosis: Difficulty in walking, not elsewhere classified (R26.2);Pain Pain - Right/Left: Right Pain - part of body: Hip    Time: 4010-2725 PT Time Calculation (min) (ACUTE ONLY): 21 min   Charges:   PT Evaluation $PT Eval Moderate Complexity: 1 Procedure     PT G Codes:        Alexa Carson PT, DPT  [phone]    Alexa Carson 09/04/2016, 5:04 PM

## 2016-09-04 NOTE — Progress Notes (Signed)
Lunch relief by MA Orlando Thalmann RN 

## 2016-09-04 NOTE — Op Note (Signed)
NAMEDELANO, Alexa Carson NO.:  [id number]  MEDICAL RECORD NO.:  [id number]  LOCATION:  [facility]                         FACILITY:  [facility]  PHYSICIAN:  Burnard Bunting, M.D.    DATE OF BIRTH:  [birth year]  DATE OF PROCEDURE:  09/04/2016 DATE OF DISCHARGE:                              OPERATIVE REPORT   PREOPERATIVE DIAGNOSIS:  Right hip arthritis.  POSTOPERATIVE DIAGNOSIS:  Right hip arthritis.  PROCEDURES PERFORMED:  Right total hip replacement using Corail stem size 9 press-fit, 48 mm Pinnacle cup with +4 liner, 32 mm +1 ceramic head.  SURGEON:  Burnard Bunting, M.D.  ASSISTANT:  Melvyn Neth, RNFA.  INDICATIONS:  Alexa Carson is a 71 year old patient with end-stage right hip arthritis, who presents for operative management after explanation of risks and benefits.  PROCEDURE IN DETAIL:  The patient was brought to the operating room where general anesthetic was induced.  Preoperative antibiotics were administered.  Time-out was called.  The patient was placed on the Hana bed with the legs in neutral position.  Right hip was prescrubbed with alcohol and Betadine, allowed to air dry, prepped with DuraPrep solution and draped in sterile manner.  Alexa Carson was used to cover the operative field.  Time-out called.  Anterior approach to the hip was made beginning about 2 cm posterior and inferior to the anterior superior iliac crest.  The incision was made, skin and subcutaneous tissue were sharply divided.  The tensor fascia lata was encountered.  It was divided along its mid section.  The fascia lata was then mobilized laterally.  Cobra retractor was placed on the superior aspect of the femoral neck.  Soft tissue dissection was then performed on the capsule of the femoral neck.  The Cobra retractor was then placed on the anterior inferior femoral neck.  Capsulotomy was performed.  Tagged with a #1 Vicryl suture.  The femoral neck cut was then made under x-ray guidance.  The patient  was short and there was essentially no margin for air.  The head was removed and sized.  A bent Hohmann retractor then placed at the 3 o'clock position on the acetabulum.  Another sharp Cobra retractor placed posteriorly.  At this time, the labrum was excised under direct visualization.  Pulvinar also excised.  Reaming then performed under fluoroscopic guidance up to 48, 48 press-fit cup then placed, 1 screw placed.  +4 liner placed.  All fixation was secure. Attention was then directed toward the femur.  Femoral lift hook was placed.  Leg was extended and adducted.  The conjoint tendon was released along with the capsule superiorly.  Exposure on the femur was obtained with about 130 of external rotation.  The femur was then broached up to a size 9.  Trialed with a size 9, 32 +1.  Radiographs looked good.  True components placed with same stability parameters maintained, very good stability to internal rotation, good stability with external rotation at 60 degrees and extension of about 50 degrees. True components placed with same stability parameters maintained. Tranexamic acid sponge placed within the incision for about 3 minutes after thorough irrigation performed.  Then, the capsule was closed using #  1 Vicryl suture, followed by #1 Vicryl suture to reapproximate the fascia lata.  Care was taken to avoid injury to the lateral femoral cutaneous nerve whenever it was possible.  At this time, the superficial tissue which was very deep was closed in layers using a 0 Vicryl suture, followed by 2-0 Vicryl suture, and 3-0 nylon.  Leg lengths approximately equal at the conclusion of the case. The patient tolerated the procedure well without immediate complication. Transferred to the recovery room in stable condition.     Burnard Bunting, M.D.     GSD/MEDQ  D:  09/04/2016  T:  09/04/2016  Job:  [id number]

## 2016-09-04 NOTE — Brief Op Note (Signed)
09/04/2016  11:57 AM  PATIENT:  Alexa Carson  71 y.o. female  PRE-OPERATIVE DIAGNOSIS:  RIGHT HIP OSTEOARTHRITIS  POST-OPERATIVE DIAGNOSIS:  RIGHT HIP OSTEOARTHRITIS  PROCEDURE:  Procedure(s): RIGHT TOTAL HIP ARTHROPLASTY ANTERIOR APPROACH  SURGEON:  Surgeon(s): Cammy Copa, MD  ASSISTANT: Francene Finders rnfa ANESTHESIA:   general  EBL: 450 ml    Total I/O In: 1000 [I.V.:1000] Out: 1600 [Urine:600; Blood:1000]  BLOOD ADMINISTERED: none  DRAINS: none   LOCAL MEDICATIONS USED:  none  SPECIMEN:  No Specimen  COUNTS:  YES  TOURNIQUET:  * No tourniquets in log *  DICTATION: .Other Dictation: Dictation Number [id number]  PLAN OF CARE: Admit to inpatient   PATIENT DISPOSITION:  PACU - hemodynamically stable

## 2016-09-04 NOTE — Anesthesia Procedure Notes (Signed)
Procedure Name: Intubation Date/Time: 09/04/2016 8:22 AM Performed by: Geraldo Docker Pre-anesthesia Checklist: Patient identified, Patient being monitored, Timeout performed, Emergency Drugs available and Suction available Patient Re-evaluated:Patient Re-evaluated prior to inductionOxygen Delivery Method: Circle System Utilized Preoxygenation: Pre-oxygenation with 100% oxygen Intubation Type: IV induction Ventilation: Mask ventilation without difficulty and Oral airway inserted - appropriate to patient size Laryngoscope Size: Miller and 3 Grade View: Grade I Tube type: Oral Tube size: 7.5 mm Number of attempts: 1 Airway Equipment and Method: Stylet Placement Confirmation: ETT inserted through vocal cords under direct vision,  positive ETCO2 and breath sounds checked- equal and bilateral Secured at: 21 cm Tube secured with: Tape Dental Injury: Teeth and Oropharynx as per pre-operative assessment

## 2016-09-04 NOTE — Transfer of Care (Signed)
Immediate Anesthesia Transfer of Care Note  [name]  Procedure(s) Performed: Procedure(s): RIGHT TOTAL HIP ARTHROPLASTY ANTERIOR APPROACH (Right)  Patient Location: PACU  Anesthesia Type:General  Level of Consciousness: sedated and patient cooperative  Airway & Oxygen Therapy: Patient Spontanous Breathing and Patient connected to face mask oxygen  Post-op Assessment: Report given to RN and Post -op Vital signs reviewed and stable  Post vital signs: Reviewed and stable  Last Vitals:  Vitals:   09/04/16 0622  BP: 127/70  Pulse: 92  Resp: 20  Temp: 36.9 C    Last Pain:  Vitals:   09/04/16 0622  TempSrc: Oral  PainSc:          Complications: No apparent anesthesia complications

## 2016-09-04 NOTE — Anesthesia Postprocedure Evaluation (Addendum)
Anesthesia Post Note  [name]  Procedure(s) Performed: Procedure(s) (LRB): RIGHT TOTAL HIP ARTHROPLASTY ANTERIOR APPROACH (Right)  Patient location during evaluation: PACU Anesthesia Type: General Level of consciousness: awake and alert Pain management: pain level controlled Vital Signs Assessment: post-procedure vital signs reviewed and stable Respiratory status: spontaneous breathing, nonlabored ventilation, respiratory function stable and patient connected to nasal cannula oxygen Cardiovascular status: blood pressure returned to baseline and stable Postop Assessment: no signs of nausea or vomiting Anesthetic complications: no       Last Vitals:  Vitals:   09/04/16 1318 09/04/16 1400  BP: (!) 160/88   Pulse: 87   Resp: 16   Temp:  36.4 C    Last Pain:  Vitals:   09/04/16 1315  TempSrc:   PainSc: 6                  Shelton Silvas

## 2016-09-04 NOTE — H&P (Addendum)
TOTAL HIP ADMISSION H&P  Patient is admitted for right total hip arthroplasty.  Subjective:  Chief Complaint: right hip pain  HPI: Alexa Carson, 71 y.o. female, has a history of pain and functional disability in the right hip(s) due to arthritis and patient has failed non-surgical conservative treatments for greater than 12 weeks to include NSAID's and/or analgesics, corticosteriod injections, use of assistive devices and activity modification.  Onset of symptoms was gradual starting 2 years ago with rapidlly worsening course since that time.The patient noted no past surgery on the right hip(s).  Patient currently rates pain in the right hip at 9 out of 10 with activity. Patient has night pain, worsening of pain with activity and weight bearing, trendelenberg gait, pain that interfers with activities of daily living and crepitus. Patient has evidence of periarticular osteophytes and joint space narrowing by imaging studies. This condition presents safety issues increasing the risk of falls. This patient has had good result with bilateral tka.  There is no current active infection.  Patient Active Problem List   Diagnosis Date Noted  . Chronic pain of right knee 07/22/2016  . Pain in right hip 07/22/2016   Past Medical History:  Diagnosis Date  . Arthritis   . Asthma   . History of kidney stones   . Hypertension   . Pneumonia    child  . Sleep apnea    cpap    Past Surgical History:  Procedure Laterality Date  . ABDOMINAL HYSTERECTOMY  82  . BLADDER NECK SUSPENSION  82  . BREAST SURGERY  89   augmentation  . CHOLECYSTECTOMY  71  . JOINT REPLACEMENT  12   left knee  . SHOULDER ARTHROSCOPY WITH ROTATOR CUFF REPAIR Left 08/22/2013   Procedure: SHOULDER ARTHROSCOPY with debridement WITH Open ROTATOR CUFF REPAIR;  Surgeon: Cammy Copa, MD;  Location: [facility] OR;  Service: Orthopedics;  Laterality: Left;  . TOTAL KNEE ARTHROPLASTY Right 04/04/2013   Procedure: TOTAL KNEE ARTHROPLASTY;   Surgeon: Cammy Copa, MD;  Location: [facility] OR;  Service: Orthopedics;  Laterality: Right;    Prescriptions Prior to Admission  Medication Sig Dispense Refill Last Dose  . alendronate (FOSAMAX) 70 MG tablet Take 70 mg by mouth once a week. Takes on Sundays   08/30/2016  . atorvastatin (LIPITOR) 10 MG tablet Take 10 mg by mouth daily. Takes in AM   09/04/2016 at 0500  . Biotin 91478 MCG TABS Take 1 tablet by mouth daily.   09/04/2016 at 0500  . Calcium Carb-Cholecalciferol (CALCIUM 600 + D PO) Take 1 tablet by mouth daily.   09/04/2016 at 0500  . celecoxib (CELEBREX) 200 MG capsule Take 200 mg by mouth daily.   09/02/2016  . cholecalciferol (VITAMIN D) 400 units TABS tablet Take 800 Units by mouth daily.   09/04/2016 at 0500  . furosemide (LASIX) 20 MG tablet Take 20 mg by mouth daily.    09/04/2016 at 0500  . lisinopril-hydrochlorothiazide (PRINZIDE,ZESTORETIC) 20-12.5 MG per tablet Take 1 tablet by mouth daily.   09/04/2016 at 0500  . montelukast (SINGULAIR) 10 MG tablet Take 10 mg by mouth daily.    Past Week at Unknown time  . Turmeric 500 MG CAPS Take 500 mg by mouth daily.   09/04/2016 at 0500  . albuterol (PROVENTIL HFA;VENTOLIN HFA) 108 (90 BASE) MCG/ACT inhaler Inhale 2 puffs into the lungs every 6 (six) hours as needed for wheezing or shortness of breath.    Unknown at Unknown time  .  methocarbamol (ROBAXIN) 500 MG tablet Take 1 tablet (500 mg total) by mouth 4 (four) times daily. (Patient not taking: Reported on 08/26/2016) 30 tablet 0 Not Taking at Unknown time  . naproxen sodium (ANAPROX) 220 MG tablet Take 440 mg by mouth daily as needed (pain).   Unknown at Unknown time  . oxyCODONE-acetaminophen (ROXICET) 5-325 MG per tablet Take 1-2 tablets by mouth every 6 (six) hours as needed for severe pain. (Patient not taking: Reported on 08/26/2016) 60 tablet 0 Not Taking at Unknown time   Allergies  Allergen Reactions  . Augmentin [Amoxicillin-Pot Clavulanate] Hives and Shortness Of Breath  .  Codeine   . Penicillins Other (See Comments)    Unknown Has patient had a PCN reaction causing immediate rash, facial/tongue/throat swelling, SOB or lightheadedness with hypotension: unknown Has patient had a PCN reaction causing severe rash involving mucus membranes or skin necrosis: unknown Has patient had a PCN reaction that required hospitalization No Has patient had a PCN reaction occurring within the last 10 years: No If all of the above answers are "NO", then may proceed with Cephalosporin use.   . Sulfa Antibiotics Other (See Comments)    Upset stomach, harder to breathe  . Latex Rash    Burn with adhesive    Social History  Substance Use Topics  . Smoking status: Never Smoker  . Smokeless tobacco: Never Used  . Alcohol use No    History reviewed. No pertinent family history.   Review of Systems  Musculoskeletal: Positive for joint pain.  All other systems reviewed and are negative.   Objective:  Physical Exam  Constitutional: She appears well-developed.  HENT:  Head: Normocephalic.  Eyes: Pupils are equal, round, and reactive to light.  Neck: Normal range of motion.  Cardiovascular: Normal rate.   Respiratory: Effort normal.  Neurological: She is alert.  Skin: Skin is warm.  Psychiatric: She has a normal mood and affect.  leg lengths ok - df pf 5/5 right - dp 2/4 - groin pain with ir er right hip - skin ok at groin crease  Vital signs in last 24 hours: Temp:  [98.4 F (36.9 C)] 98.4 F (36.9 C) (04/20 0622) Pulse Rate:  [92] 92 (04/20 0622) Resp:  [20] 20 (04/20 0622) BP: (127)/(70) 127/70 (04/20 0622) SpO2:  [99 %] 99 % (04/20 0622) Weight:  [279 lb (126.6 kg)] 279 lb (126.6 kg) (04/20 0622)  Labs:   Estimated body mass index is 56.35 kg/m as calculated from the following:   Height as of this encounter:  (1.499 m).   Weight as of this encounter: 279 lb (126.6 kg).   Imaging Review Plain radiographs demonstrate moderate degenerative joint  disease of the right hip(s). The bone quality appears to be good for age and reported activity level.  Assessment/Plan:  End stage arthritis, right hip(s)  The patient history, physical examination, clinical judgement of the provider and imaging studies are consistent with end stage degenerative joint disease of the right hip(s) and total hip arthroplasty is deemed medically necessary. The treatment options including medical management, injection therapy, arthroscopy and arthroplasty were discussed at length. The risks and benefits of total hip arthroplasty were presented and reviewed. The risks due to aseptic loosening, infection, stiffness, dislocation/subluxation,  thromboembolic complications and other imponderables were discussed.  The patient acknowledged the explanation, agreed to proceed with the plan and consent was signed. Patient is being admitted for inpatient treatment for surgery, pain control, PT, OT, prophylactic antibiotics, VTE prophylaxis,  progressive ambulation and ADL's and discharge planning.The patient is planning to be discharged to skilled nursing facility

## 2016-09-05 LAB — CBC
HEMATOCRIT: 28.9 % — AB (ref 36.0–46.0)
HEMOGLOBIN: 9.5 g/dL — AB (ref 12.0–15.0)
MCH: 29.5 pg (ref 26.0–34.0)
MCHC: 32.9 g/dL (ref 30.0–36.0)
MCV: 89.8 fL (ref 78.0–100.0)
Platelets: 352 10*3/uL (ref 150–400)
RBC: 3.22 MIL/uL — ABNORMAL LOW (ref 3.87–5.11)
RDW: 12.5 % (ref 11.5–15.5)
WBC: 14.1 10*3/uL — AB (ref 4.0–10.5)

## 2016-09-05 NOTE — Progress Notes (Signed)
Subjective: 1 Day Post-Op Procedure(s) (LRB): RIGHT TOTAL HIP ARTHROPLASTY ANTERIOR APPROACH (Right) Patient reports pain as mild.    Objective: Vital signs in last 24 hours: Temp:  [97.6 F (36.4 C)-98.6 F (37 C)] 98.2 F (36.8 C) (04/21 0428) Pulse Rate:  [84-105] 105 (04/21 0752) Resp:  [14-21] 16 (04/20 1619) BP: (116-179)/(58-96) 132/67 (04/21 0752) SpO2:  [97 %-100 %] 100 % (04/21 0752)  Intake/Output from previous day: 04/20 0701 - 04/21 0700 In: 2330 [P.O.:480; I.V.:1850] Out: 2550 [Urine:1550; Blood:1000] Intake/Output this shift: Total I/O In: 240 [P.O.:240] Out: -    Recent Labs  09/05/16 0422  HGB 9.5*    Recent Labs  09/05/16 0422  WBC 14.1*  RBC 3.22*  HCT 28.9*  PLT 352   No results for input(s): NA, K, CL, CO2, BUN, CREATININE, GLUCOSE, CALCIUM in the last 72 hours. No results for input(s): LABPT, INR in the last 72 hours.  Neurologically intact ABD soft  Assessment/Plan: 1 Day Post-Op Procedure(s) (LRB): RIGHT TOTAL HIP ARTHROPLASTY ANTERIOR APPROACH (Right) Advance diet Up with therapy Plan for discharge tomorrow OOB withPT,dressing clean and dry-doing well Alexa Carson 09/05/2016, 10:07 AM

## 2016-09-05 NOTE — Progress Notes (Signed)
Physical Therapy Treatment Patient Details Name: Alexa Carson MRN: [id number] DOB: [birth year] Today's Date: 09/05/2016    History of Present Illness Pt is a 71 yo female admitted on 09/04/16 for elective right THA. PMH significant for Right TKA, Right shoulder replacement, asthma, OA, HTN, pneumonia.     PT Comments    Pt is moving slower this PM session with increased c/o right hip discomfort. Pt reports "getting behind" her pain medication. Performed stair negotiation x 2 steps with 1 railing. Pt will benefit from additional instruction and continued instruction on HEP prior to discharge home.     Follow Up Recommendations  Home health PT     Equipment Recommendations  None recommended by PT    Recommendations for Other Services       Precautions / Restrictions Precautions Precautions: Fall Restrictions Weight Bearing Restrictions: Yes RLE Weight Bearing: Weight bearing as tolerated    Mobility  Bed Mobility Overal bed mobility: Needs Assistance Bed Mobility: Sit to Supine     Supine to sit: Mod assist     General bed mobility comments: Mod A to bring LE's back into bed. Pt able to reposition without assistance  Transfers Overall transfer level: Needs assistance Equipment used: Rolling walker (2 wheeled) Transfers: Sit to/from Stand Sit to Stand: Min guard         General transfer comment: Min guard this session from EOb. Good hand placement  Ambulation/Gait Ambulation/Gait assistance: Min assist Ambulation Distance (Feet): 100 Feet Assistive device: Rolling walker (2 wheeled) Gait Pattern/deviations: Step-through pattern;Antalgic Gait velocity: decreased Gait velocity interpretation: Below normal speed for age/gender General Gait Details: continues with moderate antalgic gait. Requires rest break after first 83' and then is able to continue.   Stairs Stairs: Yes   Stair Management: One rail Right;Forwards;Step to pattern Number of Stairs: 2 General  stair comments: Cues for sequencing and hand placement on railing.   Wheelchair Mobility    Modified Rankin (Stroke Patients Only)       Balance                                            Cognition Arousal/Alertness: Awake/alert Behavior During Therapy: WFL for tasks assessed/performed Overall Cognitive Status: Within Functional Limits for tasks assessed                                        Exercises      General Comments        Pertinent Vitals/Pain Pain Assessment: 0-10 Pain Score: 8  Pain Location: right anterior knee and upper thigh with mobility Pain Descriptors / Indicators: Burning;Grimacing Pain Intervention(s): Monitored during session;Premedicated before session;Repositioned;Ice applied    Home Living   Living Arrangements: Spouse/significant other                  Prior Function            PT Goals (current goals can now be found in the care plan section) Acute Rehab PT Goals Patient Stated Goal: to get home and get moving again Progress towards PT goals: Progressing toward goals    Frequency    7X/week      PT Plan Current plan remains appropriate    Co-evaluation  End of Session Equipment Utilized During Treatment: Gait belt Activity Tolerance: Patient limited by pain Patient left: in bed;with call bell/phone within reach;with family/visitor present;with SCD's reapplied Nurse Communication: Mobility status PT Visit Diagnosis: Difficulty in walking, not elsewhere classified (R26.2);Pain Pain - Right/Left: Right Pain - part of body: Hip     Time: 1308-6578 PT Time Calculation (min) (ACUTE ONLY): 20 min  Charges:  $Gait Training: 8-22 mins                    G Codes:       [name] PT, DPT  [phone]    Ruel Favors Aletha Halim 09/05/2016, 2:38 PM

## 2016-09-05 NOTE — Progress Notes (Signed)
Physical Therapy Treatment Patient Details Name: Alexa Carson MRN: [id number] DOB: 01/26/1946 Today's Date: 09/05/2016    History of Present Illness Pt is a 70 yo female admitted on 09/04/16 for elective right THA. PMH significant for Right TKA, Right shoulder replacement, asthma, OA, HTN, pneumonia.     PT Comments    Pt is POD 1 and continues to be moving well with therapy this AM. Pt is able to increase gait distance with short standing rest breaks, improved weight bearing through RLE with gait and standing activities. Pt assisted to bathroom, unable to void at this time, but able to pull up undergarments without assistance. Improved sit to stand from recliner with supervision. Will focus on stair negotiation next session to prepare for discharge.     Follow Up Recommendations  Home health PT     Equipment Recommendations  None recommended by PT    Recommendations for Other Services       Precautions / Restrictions Precautions Precautions: Fall Restrictions Weight Bearing Restrictions: Yes RLE Weight Bearing: Weight bearing as tolerated    Mobility  Bed Mobility Overal bed mobility: Needs Assistance Bed Mobility: Supine to Sit     Supine to sit: Min assist     General bed mobility comments: Attempted to get OOB with HOB flat and no railings to mimic home. Pt is unable to get trunk upright at EOB without min A.   Transfers Overall transfer level: Needs assistance Equipment used: Rolling walker (2 wheeled) Transfers: Sit to/from Stand Sit to Stand: Min guard         General transfer comment: Min guard this session from EOb. Good hand placement  Ambulation/Gait Ambulation/Gait assistance: Min assist Ambulation Distance (Feet): 100 Feet Assistive device: Rolling walker (2 wheeled) Gait Pattern/deviations: Step-through pattern;Antalgic Gait velocity: decreased Gait velocity interpretation: Below normal speed for age/gender General Gait Details: moderate antalgic  gait improves to mild as distance increases. Improved sequencing. pt requires multiple rest breaks in standing before completeing gait.    Stairs            Wheelchair Mobility    Modified Rankin (Stroke Patients Only)       Balance Overall balance assessment: Needs assistance Sitting-balance support: No upper extremity supported;Feet supported Sitting balance-[name]: Good     Standing balance support: No upper extremity supported;During functional activity Standing balance-[name]: Fair Standing balance comment: Able to stand and pull up undergarments at RW without assistance                            Cognition Arousal/Alertness: Awake/alert Behavior During Therapy: [facility] for tasks assessed/performed Overall Cognitive Status: Within Functional Limits for tasks assessed                                        Exercises Total Joint Exercises Ankle Circles/Pumps: AROM;Both;20 reps;Supine Quad Sets: AROM;Both;10 reps;Supine Heel Slides: Right;AAROM;10 reps;Supine Hip ABduction/ADduction: AAROM;Right;10 reps;Supine    General Comments        Pertinent Vitals/Pain Pain Assessment: 0-10 Pain Score: 10-Worst pain ever Pain Location: right anterior knee and upper thigh with mobility Pain Descriptors / Indicators: Burning;Grimacing Pain Intervention(s): Monitored during session;Premedicated before session;Ice applied;Repositioned    Home Living                      Prior Function  PT Goals (current goals can now be found in the care plan section) Acute Rehab PT Goals Patient Stated Goal: to get home and get moving again    Frequency    7X/week      PT Plan Current plan remains appropriate    Co-evaluation             End of Session Equipment Utilized During Treatment: Gait belt Activity Tolerance: Patient tolerated treatment well Patient left: in chair;with call bell/phone within  reach Nurse Communication: Mobility status PT Visit Diagnosis: Difficulty in walking, not elsewhere classified (R26.2);Pain Pain - Right/Left: Right Pain - part of body: Hip     Time: 1610-9604 PT Time Calculation (min) (ACUTE ONLY): 45 min  Charges:  $Gait Training: 8-22 mins $Therapeutic Exercise: 8-22 mins $Therapeutic Activity: 8-22 mins                    G Codes:       [name] PT, DPT  [phone]    Alexa Carson 09/05/2016, 8:36 AM

## 2016-09-06 NOTE — Progress Notes (Signed)
Patient ID: Alexa Carson, female   DOB: June 13, 1945, 71 y.o.   MRN: [id number] Making great progress.  Feels comfortable.  Vitals stable.  Hip dressing with scant drainage. She would like one more good day of therapy today before discharging home tomorrow.

## 2016-09-06 NOTE — Progress Notes (Signed)
Physical Therapy Treatment Patient Details Name: Alexa Carson MRN: [id number] DOB: 1945-12-06 Today's Date: 09/06/2016    History of Present Illness Pt is a 71 yo female admitted on 09/04/16 for elective right THA. PMH significant for Right TKA, Right shoulder replacement, asthma, OA, HTN, pneumonia.     PT Comments    Pt is POD 2 and is moving slowly with therapy this AM. Pt continues to be motivated to increase gait distance and perform exercises as instructed. Per MD, pt will stay an additional night in order to continue with PT before discharge. HHPT continues to be a good option for discharge.     Follow Up Recommendations  Home health PT     Equipment Recommendations  None recommended by PT    Recommendations for Other Services       Precautions / Restrictions Precautions Precautions: Fall Restrictions Weight Bearing Restrictions: Yes RLE Weight Bearing: Weight bearing as tolerated    Mobility  Bed Mobility Overal bed mobility: Needs Assistance Bed Mobility: Sit to Supine     Supine to sit: Mod assist     General bed mobility comments: Mod A to bring LE's back into bed. Pt able to reposition without assistance  Transfers Overall transfer level: Needs assistance Equipment used: Rolling walker (2 wheeled) Transfers: Sit to/from Stand Sit to Stand: Min guard         General transfer comment: Min guard for safety from recliner to RW.   Ambulation/Gait Ambulation/Gait assistance: Min guard Ambulation Distance (Feet): 100 Feet (50x2) Assistive device: Rolling walker (2 wheeled) Gait Pattern/deviations: Step-through pattern;Antalgic Gait velocity: decreased Gait velocity interpretation: Below normal speed for age/gender General Gait Details: continues to with moderate antalgic gait and requires 1 rest break. Pt with increased c/o pain and fatigue with gait this session.    Stairs            Wheelchair Mobility    Modified Rankin (Stroke Patients  Only)       Balance Overall balance assessment: Needs assistance Sitting-balance support: No upper extremity supported;Feet supported Sitting balance-[name]: Good     Standing balance support: No upper extremity supported;During functional activity Standing balance-[name]: Fair Standing balance comment: Able to stand and pull up undergarments at RW without assistance                            Cognition Arousal/Alertness: Awake/alert Behavior During Therapy: [facility] for tasks assessed/performed Overall Cognitive Status: Within Functional Limits for tasks assessed                                        Exercises Total Joint Exercises Hip ABduction/ADduction: AROM;Right;10 reps;Standing Marching in Standing: AROM;Right;10 reps;Standing    General Comments        Pertinent Vitals/Pain Pain Assessment: Faces Faces Pain Scale: Hurts even more Pain Location: right anterior knee and upper thigh with mobility Pain Descriptors / Indicators: Burning;Grimacing Pain Intervention(s): Premedicated before session;Monitored during session;Repositioned;Ice applied    Home Living                      Prior Function            PT Goals (current goals can now be found in the care plan section) Acute Rehab PT Goals Patient Stated Goal: to get home and get moving again Progress  towards PT goals: Progressing toward goals    Frequency    7X/week      PT Plan Current plan remains appropriate    Co-evaluation             End of Session Equipment Utilized During Treatment: Gait belt Activity Tolerance: Patient limited by pain Patient left: in bed;with call bell/phone within reach Nurse Communication: Mobility status PT Visit Diagnosis: Difficulty in walking, not elsewhere classified (R26.2);Pain Pain - Right/Left: Right Pain - part of body: Hip     Time: 0752-0822 PT Time Calculation (min) (ACUTE ONLY): 30 min  Charges:   $Gait Training: 8-22 mins $Therapeutic Exercise: 8-22 mins                    G Codes:       [name] PT, DPT  [phone]    [name] 09/06/2016, 8:26 AM

## 2016-09-06 NOTE — Progress Notes (Signed)
Physical Therapy Treatment Patient Details Name: Alexa Carson MRN: [id number] DOB: 23-Oct-1945 Today's Date: 09/06/2016    History of Present Illness Pt is a 71 yo female admitted on 09/04/16 for elective right THA. PMH significant for Right TKA, Right shoulder replacement, asthma, OA, HTN, pneumonia.     PT Comments    Pt continues to be moving well with therapy and is slightly limited by pain this session. Pt is, however, able to perform gait without a seated rest break this session. Pt with increased complaints of muscle discomfort with mobility in anterior thigh. Pt will require additional instruction on stair negotiation prior to discharge home.     Follow Up Recommendations  Home health PT     Equipment Recommendations  None recommended by PT    Recommendations for Other Services       Precautions / Restrictions Precautions Precautions: Fall Restrictions Weight Bearing Restrictions: Yes RLE Weight Bearing: Weight bearing as tolerated    Mobility  Bed Mobility               General bed mobility comments: sitting EOB when PT arrives  Transfers Overall transfer level: Needs assistance Equipment used: Rolling walker (2 wheeled) Transfers: Sit to/from Stand Sit to Stand: Min guard         General transfer comment: Min guard for safety from recliner to RW.   Ambulation/Gait Ambulation/Gait assistance: Min guard Ambulation Distance (Feet): 100 Feet Assistive device: Rolling walker (2 wheeled) Gait Pattern/deviations: Step-through pattern;Antalgic Gait velocity: decreased Gait velocity interpretation: Below normal speed for age/gender General Gait Details: continues to with moderate antalgic gait and requires 1 rest break. Pt with increased c/o pain and fatigue with gait this session.    Stairs            Wheelchair Mobility    Modified Rankin (Stroke Patients Only)       Balance Overall balance assessment: Needs assistance Sitting-balance  support: No upper extremity supported;Feet supported Sitting balance-[name]: Good     Standing balance support: No upper extremity supported;During functional activity Standing balance-[name]: Fair                              Cognition Arousal/Alertness: Awake/alert Behavior During Therapy: WFL for tasks assessed/performed Overall Cognitive Status: Within Functional Limits for tasks assessed                                        Exercises Total Joint Exercises Hip ABduction/ADduction: AROM;Right;10 reps;Standing Long Arc Quad: AROM;Right;10 reps;Seated Standing Hip Extension: AROM;Right;10 reps;Standing    General Comments        Pertinent Vitals/Pain Pain Assessment: Faces Faces Pain Scale: Hurts even more Pain Location: right anterior knee and upper thigh with mobility Pain Descriptors / Indicators: Burning;Grimacing Pain Intervention(s): Repositioned;Monitored during session;Ice applied    Home Living                      Prior Function            PT Goals (current goals can now be found in the care plan section) Acute Rehab PT Goals Patient Stated Goal: to get home and get moving again Progress towards PT goals: Progressing toward goals    Frequency    7X/week      PT Plan Current plan remains appropriate  Co-evaluation             End of Session Equipment Utilized During Treatment: Gait belt Activity Tolerance: Patient tolerated treatment well Patient left: in chair;with call bell/phone within reach Nurse Communication: Mobility status PT Visit Diagnosis: Difficulty in walking, not elsewhere classified (R26.2);Pain Pain - Right/Left: Right Pain - part of body: Hip     Time: 9528-4132 PT Time Calculation (min) (ACUTE ONLY): 18 min  Charges:  $Gait Training: 8-22 mins                    G Codes:       Alexa Carson PT, DPT  [phone]   Ruel Favors Aletha Halim 09/06/2016, 2:25 PM

## 2016-09-07 ENCOUNTER — Encounter (HOSPITAL_COMMUNITY): Payer: Self-pay | Admitting: Orthopedic Surgery

## 2016-09-07 ENCOUNTER — Inpatient Hospital Stay (HOSPITAL_COMMUNITY): Payer: Medicare Other

## 2016-09-07 MED ORDER — MAGNESIUM CITRATE PO SOLN
1.0000 | Freq: Once | ORAL | Status: AC
  Administered 2016-09-07: 1 via ORAL
  Filled 2016-09-07: qty 296

## 2016-09-07 MED ORDER — RIVAROXABAN 10 MG PO TABS: 10.0000 mg | ORAL_TABLET | Freq: Every day | ORAL | 0 refills | Status: AC

## 2016-09-07 MED ORDER — METHOCARBAMOL 500 MG PO TABS: 500.0000 mg | ORAL_TABLET | Freq: Four times a day (QID) | ORAL | 0 refills | Status: DC | PRN

## 2016-09-07 MED ORDER — OXYCODONE HCL 5 MG PO TABS: 5.0000 mg | ORAL_TABLET | ORAL | 0 refills | Status: AC | PRN

## 2016-09-07 NOTE — Progress Notes (Signed)
An After Visit Summary was printed and given to the patient. Patient received prescription. Discharge paperwork reviewed with patient and all questions answered.  D/c education completed with patient/family including follow up instructions, medication list, d/c activities limitations if indicated, with other d/c instructions as indicated by MD - patient able to verbalize understanding, all questions fully answered.   Patient instructed to return to ED, call 911, or call MD for any changes in condition.   Patient to be escorted via WC, and D/C home via private auto.  Davieon Stockham C 09/07/2016 3:57 PM

## 2016-09-07 NOTE — Progress Notes (Signed)
PT Cancellation Note  Patient Details Name: Alexa Carson MRN: [id number] DOB: 03-31-1946   Cancelled Treatment:    Reason Eval/Treat Not Completed: Other (comment) (Pt sitting on toilet attempting to have a BM will return when patient is ready to participate.  )   Florestine Avers 09/07/2016, 11:55 AM Joycelyn Rua, PTA pager [phone]

## 2016-09-07 NOTE — Discharge Summary (Signed)
Physician Discharge Summary  Patient ID: Alexa Carson MRN: [id number] DOB/AGE: 70-Oct-1947 70 y.o.  Admit date: 09/04/2016 Discharge date: 09/07/2016  Admission Diagnoses:  Active Problems:   Hip arthritis   Discharge Diagnoses:  Same  Surgeries: Procedure(s): RIGHT TOTAL HIP ARTHROPLASTY ANTERIOR APPROACH on 09/04/2016   Consultants:   Discharged Condition: Stable  Hospital Course: Alexa Carson is an 71 y.o. female who was admitted 09/04/2016 with a chief complaint of Right hip pain, and found to have a diagnosis of right hip arthritis.  They were brought to the operating room on 09/04/2016 and underwent the above named procedures.  She tolerated procedure well and was ambulating on postop day #1 with the help of physical therapy.  Blood values remained stable during the hospitalization.  Patient was placed on Xarelto for DVT prophylaxis.  Patient was reporting some knee pain but those radiographs were normal on the day of discharge.  She will follow up with me in 10 days for clinical recheck  Antibiotics given:  Anti-infectives    Start     Dose/Rate Route Frequency Ordered Stop   09/04/16 1800  vancomycin (VANCOCIN) IVPB 1000 mg/200 mL premix     1,000 mg 200 mL/hr over 60 Minutes Intravenous Every 12 hours 09/04/16 1423 09/04/16 1846   09/04/16 0600  clindamycin (CLEOCIN) IVPB 900 mg     900 mg 100 mL/hr over 30 Minutes Intravenous On call to O.R. 09/03/16 0865 09/04/16 0840    .  Recent vital signs:  Vitals:   09/07/16 1000 09/07/16 1529  BP: 125/62 112/63  Pulse: (!) 111 (!) 109  Resp: 18   Temp: 99.8 F (37.7 C) 99.4 F (37.4 C)    Recent laboratory studies:  Results for orders placed or performed during the hospital encounter of 09/04/16  CBC  Result Value Ref Range   WBC 14.1 (H) 4.0 - 10.5 K/uL   RBC 3.22 (L) 3.87 - 5.11 MIL/uL   Hemoglobin 9.5 (L) 12.0 - 15.0 g/dL   HCT 78.4 (L) 69.6 - 29.5 %   MCV 89.8 78.0 - 100.0 fL   MCH 29.5 26.0 - 34.0 pg   MCHC 32.9  30.0 - 36.0 g/dL   RDW 28.4 13.2 - 44.0 %   Platelets 352 150 - 400 K/uL    Discharge Medications:   Allergies as of 09/07/2016      Reactions   Augmentin [amoxicillin-pot Clavulanate] Hives, Shortness Of Breath   Codeine    Penicillins Other (See Comments)   Unknown Has patient had a PCN reaction causing immediate rash, facial/tongue/throat swelling, SOB or lightheadedness with hypotension: unknown Has patient had a PCN reaction causing severe rash involving mucus membranes or skin necrosis: unknown Has patient had a PCN reaction that required hospitalization No Has patient had a PCN reaction occurring within the last 10 years: No If all of the above answers are "NO", then may proceed with Cephalosporin use.   Sulfa Antibiotics Other (See Comments)   Upset stomach, harder to breathe   Latex Rash   Burn with adhesive      Medication List    STOP taking these medications   celecoxib 200 MG capsule Commonly known as:  CELEBREX   naproxen sodium 220 MG tablet Commonly known as:  ANAPROX   oxyCODONE-acetaminophen 5-325 MG tablet Commonly known as:  ROXICET     TAKE these medications   albuterol 108 (90 Base) MCG/ACT inhaler Commonly known as:  PROVENTIL HFA;VENTOLIN HFA Inhale 2 puffs into the  lungs every 6 (six) hours as needed for wheezing or shortness of breath.   alendronate 70 MG tablet Commonly known as:  FOSAMAX Take 70 mg by mouth once a week. Takes on Sundays   atorvastatin 10 MG tablet Commonly known as:  LIPITOR Take 10 mg by mouth daily. Takes in AM   Biotin 96045 MCG Tabs Take 1 tablet by mouth daily.   CALCIUM 600 + D PO Take 1 tablet by mouth daily.   cholecalciferol 400 units Tabs tablet Commonly known as:  VITAMIN D Take 800 Units by mouth daily.   furosemide 20 MG tablet Commonly known as:  LASIX Take 20 mg by mouth daily.   lisinopril-hydrochlorothiazide 20-12.5 MG tablet Commonly known as:  PRINZIDE,ZESTORETIC Take 1 tablet by mouth  daily.   methocarbamol 500 MG tablet Commonly known as:  ROBAXIN Take 1 tablet (500 mg total) by mouth every 6 (six) hours as needed for muscle spasms. What changed:  when to take this  reasons to take this   montelukast 10 MG tablet Commonly known as:  SINGULAIR Take 10 mg by mouth daily.   oxyCODONE 5 MG immediate release tablet Commonly known as:  Oxy IR/ROXICODONE Take 1-2 tablets (5-10 mg total) by mouth every 3 (three) hours as needed for breakthrough pain.   rivaroxaban 10 MG Tabs tablet Commonly known as:  XARELTO Take 1 tablet (10 mg total) by mouth daily with breakfast. Start taking on:  09/08/2016   Turmeric 500 MG Caps Take 500 mg by mouth daily.       Diagnostic Studies: Dg Chest 2 View  Result Date: 08/31/2016 CLINICAL DATA:  71 year old female under preoperative evaluation prior to right hip total arthroplasty. EXAM: CHEST  2 VIEW COMPARISON:  Chest x-ray 03/27/2013. FINDINGS: Lung volumes are normal. No consolidative airspace disease. No pleural effusions. No pneumothorax. No pulmonary nodule or mass noted. Pulmonary vasculature and the cardiomediastinal silhouette are within normal limits. Aortic atherosclerosis. IMPRESSION: 1. No radiographic evidence of acute cardiopulmonary disease. 2. Aortic atherosclerosis. Electronically Signed   By: Trudie Reed M.D.   On: 08/31/2016 19:13   Dg Knee 1-2 Views Right  Result Date: 09/07/2016 CLINICAL DATA:  71 year old female with right knee pain. Previous bilateral knee replacement. EXAM: RIGHT KNEE - 1-2 VIEW COMPARISON:  07/22/2016. FINDINGS: Right total knee arthroplasty hardware appears stable and intact. No knee joint effusion identified. No acute osseous abnormality identified. IMPRESSION: Stable postoperative appearance of the right knee. No acute osseous abnormality identified. Electronically Signed   By: Odessa Fleming M.D.   On: 09/07/2016 11:15   Dg C-arm Gt 120 Min  Result Date: 09/04/2016 CLINICAL DATA:  Right  total hip arthroplasty EXAM: DG C-ARM GT 120 MIN; OPERATIVE RIGHT HIP WITH PELVIS CONTRAST:  None FLUOROSCOPY TIME:  Fluoroscopy Time:  1 minute 3 seconds COMPARISON:  10/28/2015 FINDINGS: Four views of the right hip submitted. There is right hip prosthesis with anatomic alignment. IMPRESSION: Right hip prosthesis with anatomic alignment. Please see the operative report. Fluoroscopy time was 1 minute 3 seconds. Electronically Signed   By: Natasha Mead M.D.   On: 09/04/2016 11:20   Dg Hip Operative Unilat W Or W/o Pelvis Right  Result Date: 09/04/2016 CLINICAL DATA:  Right total hip arthroplasty EXAM: DG C-ARM GT 120 MIN; OPERATIVE RIGHT HIP WITH PELVIS CONTRAST:  None FLUOROSCOPY TIME:  Fluoroscopy Time:  1 minute 3 seconds COMPARISON:  10/28/2015 FINDINGS: Four views of the right hip submitted. There is right hip prosthesis with anatomic alignment.  IMPRESSION: Right hip prosthesis with anatomic alignment. Please see the operative report. Fluoroscopy time was 1 minute 3 seconds. Electronically Signed   By: Natasha Mead M.D.   On: 09/04/2016 11:20    Disposition: 01-Home or Self Care  Discharge Instructions    Call MD / Call 911    Complete by:  As directed    If you experience chest pain or shortness of breath, CALL 911 and be transported to the hospital emergency room.  If you develope a fever above 101 F, pus (white drainage) or increased drainage or redness at the wound, or calf pain, call your surgeon's office.   Constipation Prevention    Complete by:  As directed    Drink plenty of fluids.  Prune juice may be helpful.  You may use a stool softener, such as Colace (over the counter) 100 mg twice a day.  Use MiraLax (over the counter) for constipation as needed.   Diet - low sodium heart healthy    Complete by:  As directed    Discharge instructions    Complete by:  As directed    Weight bearing as tolerated with walker Leave incision  Dressing on until Friday Ok to shower with dressing in  place   Increase activity slowly as tolerated    Complete by:  As directed       Follow-up Information    [facility] Follow up.   Specialty:  Home Health Services Why:  A representative from [facility] will contact you to arrange start date and time for your therapy. Contact information: [address] [place] [address] [place] [address] [phone]            Signed: Burnard Bunting 09/07/2016, 6:00 PM

## 2016-09-07 NOTE — Progress Notes (Signed)
Physical Therapy Treatment Patient Details Name: Alexa Carson MRN: [id number] DOB: 1946/01/26 Today's Date: 09/07/2016    History of Present Illness Pt is a 71 yo female admitted on 09/04/16 for elective right THA. PMH significant for Right TKA, Right shoulder replacement, asthma, OA, HTN, pneumonia.     PT Comments    Pt refused OOB mobility but agreeable to review and perform supine exercises in bed.  Pt educated on correct technique during session and frequency of exercises at home.  Pt able to teach back method and instruction to PTA.     Follow Up Recommendations  Home health PT     Equipment Recommendations  None recommended by PT    Recommendations for Other Services       Precautions / Restrictions Precautions Precautions: Fall Restrictions Weight Bearing Restrictions: Yes RLE Weight Bearing: Weight bearing as tolerated          Cognition Arousal/Alertness: Awake/alert Behavior During Therapy: WFL for tasks assessed/performed Overall Cognitive Status: Within Functional Limits for tasks assessed                                        Exercises Total Joint Exercises Ankle Circles/Pumps: AROM;Both;20 reps;Supine Quad Sets: AROM;10 reps;Supine;Right Short Arc Quad: AROM;Right;10 reps;Supine Heel Slides: AAROM;Right;10 reps;Supine Hip ABduction/ADduction: AROM;Right;Supine;10 reps   General Comments        Pertinent Vitals/Pain Pain Assessment: 0-10 Pain Score: 3  Pain Location: right anterior knee and upper thigh with mobility Pain Descriptors / Indicators: Burning;Grimacing Pain Intervention(s): Limited activity within patient's tolerance;Repositioned    Home Living                      Prior Function            PT Goals (current goals can now be found in the care plan section) Acute Rehab PT Goals Patient Stated Goal: to get home and get moving again Potential to Achieve Goals: Good Progress towards PT goals: Progressing  toward goals    Frequency    7X/week      PT Plan Current plan remains appropriate    Co-evaluation             End of Session Equipment Utilized During Treatment: Gait belt Activity Tolerance: Patient tolerated treatment well Patient left: in chair;with call bell/phone within reach Nurse Communication: Mobility status PT Visit Diagnosis: Difficulty in walking, not elsewhere classified (R26.2);Pain Pain - Right/Left: Right Pain - part of body: Hip     Time: 8295-6213 PT Time Calculation (min) (ACUTE ONLY): 10 min  Charges:   $Therapeutic Exercise: 8-22 mins                    G Codes:       Joycelyn Rua, PTA pager [phone]    Florestine Avers 09/07/2016, 3:27 PM

## 2016-09-07 NOTE — Progress Notes (Signed)
Physical Therapy Treatment Patient Details Name: Alexa Carson MRN: [id number] DOB: May 01, 1946 Today's Date: 09/07/2016    History of Present Illness Pt is a 71 yo female admitted on 09/04/16 for elective right THA. PMH significant for Right TKA, Right shoulder replacement, asthma, OA, HTN, pneumonia.     PT Comments    Pt performed gait and stair training in prep for d/c home.   Pt performed and reviewed HEP for home use.  PTA informed nursing that patient is ready to d/c from a mobility standpoint.   Follow Up Recommendations  Home health PT     Equipment Recommendations  None recommended by PT    Recommendations for Other Services       Precautions / Restrictions Precautions Precautions: Fall Restrictions Weight Bearing Restrictions: Yes RLE Weight Bearing: Weight bearing as tolerated    Mobility  Bed Mobility               General bed mobility comments: Pt standing in hallway on arrival.    Transfers Overall transfer level: Needs assistance Equipment used: Rolling walker (2 wheeled) Transfers: Sit to/from Stand Sit to Stand: Modified independent (Device/Increase time)         General transfer comment: Pt performed without assistance.  Poor eccentric loading back to recliner chair.    Ambulation/Gait Ambulation/Gait assistance: Supervision Ambulation Distance (Feet): 250 Feet Assistive device: Rolling walker (2 wheeled) Gait Pattern/deviations: Step-through pattern;Trunk flexed;Decreased stride length Gait velocity: decreased Gait velocity interpretation: Below normal speed for age/gender General Gait Details: Cues for RW safety and continuation of gait.  Pt with heavy breathing and increased DOE.  She reports this is due to pain.     Stairs Stairs: Yes   Stair Management: One rail Right Number of Stairs: 5 General stair comments: Cues for sequencing and hand placement on railing.   Wheelchair Mobility    Modified Rankin (Stroke Patients Only)        Balance     Sitting balance-[name]: Normal       Standing balance-[name]: Fair                              Cognition Arousal/Alertness: Awake/alert Behavior During Therapy: WFL for tasks assessed/performed Overall Cognitive Status: Within Functional Limits for tasks assessed                                        Exercises Total Joint Exercises Ankle Circles/Pumps: AROM;Both;20 reps;Supine Quad Sets: AROM;10 reps;Supine;Right Short Arc Quad: AROM;Right;10 reps;Supine Heel Slides: AAROM;Right;10 reps;Supine Hip ABduction/ADduction: AROM;Right;20 reps;Supine;Standing (Pt performed standing 1x10 and supine 1x10 reps. ) Knee Flexion: AROM;Right;10 reps;Standing Marching in Standing: AROM;Right;10 reps;Standing Standing Hip Extension: AROM;Right;10 reps;Standing (Pt holds extension to stretch.  PTA discouraged this movement but she remains to "stretch" in a bouncing motion. )    General Comments        Pertinent Vitals/Pain Pain Assessment: 0-10 Pain Score: 6  Pain Location: right anterior knee and upper thigh with mobility Pain Descriptors / Indicators: Burning;Grimacing Pain Intervention(s): Limited activity within patient's tolerance;Repositioned    Home Living                      Prior Function            PT Goals (current goals can now be found in the  care plan section) Acute Rehab PT Goals Patient Stated Goal: to get home and get moving again Potential to Achieve Goals: Good    Frequency    7X/week      PT Plan Current plan remains appropriate    Co-evaluation             End of Session   Activity Tolerance: Patient tolerated treatment well Patient left: in chair;with call bell/phone within reach Nurse Communication: Mobility status PT Visit Diagnosis: Difficulty in walking, not elsewhere classified (R26.2);Pain Pain - Right/Left: Right Pain - part of body: Hip     Time:  1610-9604 PT Time Calculation (min) (ACUTE ONLY): 17 min  Charges:  $Gait Training: 8-22 mins                    G Codes:       Alexa Carson, PTA pager [phone]    Alexa Carson 09/07/2016, 12:57 PM

## 2016-09-07 NOTE — Care Management Note (Signed)
Case Management Note  Patient Details  Name: Alexa Carson MRN: [id number] Date of Birth: Jun 25, 1945  Subjective/Objective:   71 yr old female s/p right total hip arthroplasty.                 Action/Plan: Patient was preoperatively setup with [facility], no changes. Patient has rolling walker and 3in1 at home. She will have support at discharge.   Expected Discharge Date:    23/18              Expected Discharge Plan:  Home w Home Health Services  In-House Referral:  NA  Discharge planning Services  CM Consult  Post Acute Care Choice:  Home Health Choice offered to:  Patient  DME Arranged:  (S) N/A (patient has RW and 3in1) DME Agency:  NA  HH Arranged:  PT HH Agency:  [facility] (formerly [facility]  Status of Service:  Completed, signed off  If discussed at [employer], dates discussed:    Additional Comments:  Durenda Guthrie, RN 09/07/2016, 10:25 AM

## 2016-09-07 NOTE — Progress Notes (Signed)
Subjective: Pt stable - knee xrays ok - plan dc today   Objective: Vital signs in last 24 hours: Temp:  [98.2 F (36.8 C)-99.7 F (37.6 C)] 99.4 F (37.4 C) (04/23 0500) Pulse Rate:  [113-115] 113 (04/23 0500) Resp:  [18] 18 (04/23 0500) BP: (101-113)/(58-63) 101/58 (04/23 0500) SpO2:  [99 %-100 %] 99 % (04/23 0500)  Intake/Output from previous day: 04/22 0701 - 04/23 0700 In: 476 [P.O.:476] Out: -  Intake/Output this shift: Total I/O In: 240 [P.O.:240] Out: -   Exam:  Dorsiflexion/Plantar flexion intact  Labs:  Recent Labs  09/05/16 0422  HGB 9.5*    Recent Labs  09/05/16 0422  WBC 14.1*  RBC 3.22*  HCT 28.9*  PLT 352   No results for input(s): NA, K, CL, CO2, BUN, CREATININE, GLUCOSE, CALCIUM in the last 72 hours. No results for input(s): LABPT, INR in the last 72 hours.  Assessment/Plan: Plan dc today   G Dorene Grebe 09/07/2016, 11:54 AM

## 2016-09-07 NOTE — Progress Notes (Signed)
PT Cancellation Note  Patient Details Name: Alexa Carson MRN: [id number] DOB: 1946-04-24   Cancelled Treatment:    Reason Eval/Treat Not Completed: Patient at procedure or test/unavailable (Pt off unit for xray of knee.  )   [name] [name] 09/07/2016, 11:12 AM Joycelyn Rua, PTA pager [phone]

## 2016-09-18 ENCOUNTER — Ambulatory Visit (INDEPENDENT_AMBULATORY_CARE_PROVIDER_SITE_OTHER): Payer: Medicare Other | Admitting: Orthopedic Surgery

## 2016-09-18 ENCOUNTER — Ambulatory Visit (INDEPENDENT_AMBULATORY_CARE_PROVIDER_SITE_OTHER): Payer: Medicare Other

## 2016-09-18 ENCOUNTER — Encounter (INDEPENDENT_AMBULATORY_CARE_PROVIDER_SITE_OTHER): Payer: Self-pay | Admitting: Orthopedic Surgery

## 2016-09-18 DIAGNOSIS — M25551 Pain in right hip: Secondary | ICD-10-CM | POA: Diagnosis not present

## 2016-09-18 NOTE — Progress Notes (Signed)
   Post-Op Visit Note   [name]           Date of Birth: 09/04/1945           MRN: [id number] Visit Date: 09/18/2016 PCP: PROVIDER NOT IN SYSTEM   Assessment & Plan:  Chief Complaint:  Chief Complaint  Patient presents with  . Right Hip - Routine Post Op, Follow-up   Visit Diagnoses:  1. Pain in right hip     Plan: Alexa Carson is a patient is now 2 weeks out right hip replacement.  She is doing well.  On exam she has slight Trendelenburg gait but equal leg lengths.  No groin pain with internal/external rotation leg.  Incision looks intact.  Plan at this time is for continued home health physical therapy 6 week return for final clinical recheck.  Follow-Up Instructions: Return in about 6 weeks (around 10/30/2016).   Orders:  Orders Placed This Encounter  Procedures  . XR HIP UNILAT W OR W/O PELVIS 2-3 VIEWS RIGHT   No orders of the defined types were placed in this encounter.   Imaging: Xr Hip Unilat W Or W/o Pelvis 2-3 Views Right  Result Date: 09/18/2016 AP pelvis lateral right hip reviewed.  Hip prosthesis in good  position with no evidence of fracture or complicating features.  Leg lengths approximately equal.   PMFS History: Patient Active Problem List   Diagnosis Date Noted  . Hip arthritis 09/04/2016  . Chronic pain of right knee 07/22/2016  . Pain in right hip 07/22/2016   Past Medical History:  Diagnosis Date  . Arthritis   . Asthma   . History of kidney stones   . Hypertension   . Pneumonia    child  . Sleep apnea    cpap    No family history on file.  Past Surgical History:  Procedure Laterality Date  . ABDOMINAL HYSTERECTOMY  82  . BLADDER NECK SUSPENSION  82  . BREAST SURGERY  93   augmentation  . CHOLECYSTECTOMY  70  . JOINT REPLACEMENT  12   left knee  . SHOULDER ARTHROSCOPY WITH ROTATOR CUFF REPAIR Left 08/22/2013   Procedure: SHOULDER ARTHROSCOPY with debridement WITH Open ROTATOR CUFF REPAIR;  Surgeon: Cammy CopaGregory Scott Dean, MD;   [facility];  Service: Orthopedics;  Laterality: Left;  . TOTAL HIP ARTHROPLASTY Right 09/04/2016   Procedure: RIGHT TOTAL HIP ARTHROPLASTY ANTERIOR APPROACH;  Surgeon: Cammy CopaScott Gregory Dean, MD;  Location: [facility] OR;  Service: Orthopedics;  Laterality: Right;  . TOTAL KNEE ARTHROPLASTY Right 04/04/2013   Procedure: TOTAL KNEE ARTHROPLASTY;  Surgeon: Cammy CopaGregory Scott Dean, MD;  [facility];  Service: Orthopedics;  Laterality: Right;   Social History   Occupational History  . Not on file.   Social History Main Topics  . Smoking status: Never Smoker  . Smokeless tobacco: Never Used  . Alcohol use No  . Drug use: No  . Sexual activity: Not on file

## 2016-09-25 ENCOUNTER — Telehealth (INDEPENDENT_AMBULATORY_CARE_PROVIDER_SITE_OTHER): Payer: Self-pay | Admitting: Orthopedic Surgery

## 2016-09-25 NOTE — Telephone Encounter (Signed)
Can you please advise since Dr August Saucerean is out of office?

## 2016-09-25 NOTE — Telephone Encounter (Signed)
PT CALLED AND ASKED IF SHE CAN GET AN RX FOR A MUSCLE RELAXER PLEASE.   [phone]

## 2016-09-25 NOTE — Telephone Encounter (Signed)
Robaxin 500 mg bid prn #30

## 2016-09-28 ENCOUNTER — Telehealth (INDEPENDENT_AMBULATORY_CARE_PROVIDER_SITE_OTHER): Payer: Self-pay | Admitting: Orthopedic Surgery

## 2016-09-28 MED ORDER — METHOCARBAMOL 500 MG PO TABS: 500.0000 mg | ORAL_TABLET | Freq: Three times a day (TID) | ORAL | 0 refills | Status: AC | PRN

## 2016-09-28 NOTE — Addendum Note (Signed)
Addended byPrescott Parma: Rayel Santizo on: 09/28/2016 08:21 AM   Modules accepted: Orders

## 2016-09-28 NOTE — Telephone Encounter (Signed)
Rx sent to pharmacy.  Patient is aware. 

## 2016-10-16 NOTE — Addendum Note (Signed)
Addendum  created 10/16/16 1315 by Zadaya Cuadra D, MD   Sign clinical note    

## 2016-11-02 ENCOUNTER — Ambulatory Visit (INDEPENDENT_AMBULATORY_CARE_PROVIDER_SITE_OTHER): Payer: Medicare Other | Admitting: Orthopedic Surgery

## 2016-11-02 ENCOUNTER — Encounter (INDEPENDENT_AMBULATORY_CARE_PROVIDER_SITE_OTHER): Payer: Self-pay | Admitting: Orthopedic Surgery

## 2016-11-02 DIAGNOSIS — Z96641 Presence of right artificial hip joint: Secondary | ICD-10-CM

## 2016-11-05 NOTE — Progress Notes (Signed)
   Post-Op Visit Note   Patient: Ezekiel InaMary L Mcglasson           Date of Birth: 03/23/1946           MRN: [id number] Visit Date: 11/02/2016 PCP: System, Provider Not In   Assessment & Plan:  Chief Complaint:  Chief Complaint  Patient presents with  . Right Hip - Routine Post Op   Visit Diagnoses:  1. S/P hip replacement, right     Plan: [name] is a patient who is now 2 months out right total hip replacement she's doing well she's happy with her results she's not having any pain she was working out at [employer] gym doing low-impact activities.  On exam she has normal gait equal leg lengths.  Hip flexion abduction and adduction strength.  Plan is to release her to gym activities.  We discussed the appropriate ones.  I'll see her back as needed  Follow-Up Instructions: Return if symptoms worsen or fail to improve.   Orders:  No orders of the defined types were placed in this encounter.  No orders of the defined types were placed in this encounter.   Imaging: No results found.  PMFS History: Patient Active Problem List   Diagnosis Date Noted  . Hip arthritis 09/04/2016  . Chronic pain of right knee 07/22/2016  . Pain in right hip 07/22/2016   Past Medical History:  Diagnosis Date  . Arthritis   . Asthma   . History of kidney stones   . Hypertension   . Pneumonia    child  . Sleep apnea    cpap    No family history on file.  Past Surgical History:  Procedure Laterality Date  . ABDOMINAL HYSTERECTOMY  82  . BLADDER NECK SUSPENSION  82  . BREAST SURGERY  93   augmentation  . CHOLECYSTECTOMY  70  . JOINT REPLACEMENT  12   left knee  . SHOULDER ARTHROSCOPY WITH ROTATOR CUFF REPAIR Left 08/22/2013   Procedure: SHOULDER ARTHROSCOPY with debridement WITH Open ROTATOR CUFF REPAIR;  Surgeon: Cammy CopaGregory Scott Rosann Gorum, MD;  [facility];  Service: Orthopedics;  Laterality: Left;  . TOTAL HIP ARTHROPLASTY Right 09/04/2016   Procedure: RIGHT TOTAL HIP ARTHROPLASTY ANTERIOR APPROACH;  Surgeon:  Cammy CopaScott Chantae Soo, MD;  Location: [facility] OR;  Service: Orthopedics;  Laterality: Right;  . TOTAL KNEE ARTHROPLASTY Right 04/04/2013   Procedure: TOTAL KNEE ARTHROPLASTY;  Surgeon: Cammy CopaGregory Scott Iyani Dresner, MD;  [facility];  Service: Orthopedics;  Laterality: Right;   Social History   Occupational History  . Not on file.   Social History Main Topics  . Smoking status: Never Smoker  . Smokeless tobacco: Never Used  . Alcohol use No  . Drug use: No  . Sexual activity: Not on file

## 2017-02-23 ENCOUNTER — Telehealth (INDEPENDENT_AMBULATORY_CARE_PROVIDER_SITE_OTHER): Payer: Self-pay

## 2017-02-23 ENCOUNTER — Encounter (INDEPENDENT_AMBULATORY_CARE_PROVIDER_SITE_OTHER): Payer: Self-pay

## 2017-02-23 ENCOUNTER — Telehealth (INDEPENDENT_AMBULATORY_CARE_PROVIDER_SITE_OTHER): Payer: Self-pay | Admitting: Orthopedic Surgery

## 2017-02-23 NOTE — Telephone Encounter (Signed)
Clinda 900 po 1 hour before

## 2017-02-23 NOTE — Telephone Encounter (Signed)
Please advise what you want her to take. Starts dental work on Friday. Patient has allergy to amoxicillin.  [facility] in [place].

## 2017-02-23 NOTE — Telephone Encounter (Signed)
75 mg, 150 mg, or 300 mg are our only choices.

## 2017-02-23 NOTE — Telephone Encounter (Signed)
300 x 3

## 2017-02-23 NOTE — Telephone Encounter (Signed)
Received call from [facility] dental asking if patient needed pre-med prior to dental procedure. I advised per Dr Diamantina Providence protocol she did need pre-med. Total joint was done this year. Requested a letter stating such to be faxed to them. This was faxed to [phone] as requested.

## 2017-02-23 NOTE — Telephone Encounter (Signed)
Patient request a call back regarding pre med antibiotics for dental work if needed.Please advise

## 2017-02-24 MED ORDER — CLINDAMYCIN HCL 300 MG PO CAPS: ORAL_CAPSULE | ORAL | 0 refills | Status: AC

## 2017-02-24 NOTE — Telephone Encounter (Signed)
rx submitted patient advised done.

## 2017-07-02 IMAGING — RF DG HIP (WITH PELVIS) OPERATIVE*R*
1 series · 4 of 4 positions shown · IV contrast (agent unspecified)
Comparison: 10/28/2015

CLINICAL DATA: Right total hip arthroplasty

EXAM:
DG C-ARM GT 120 MIN; OPERATIVE RIGHT HIP WITH PELVIS
CONTRAST:  None
FLUOROSCOPY TIME:  Fluoroscopy Time:  1 minute 3 seconds

[Series 1: run · 4 of 4 slices shown]
[im 1/4]
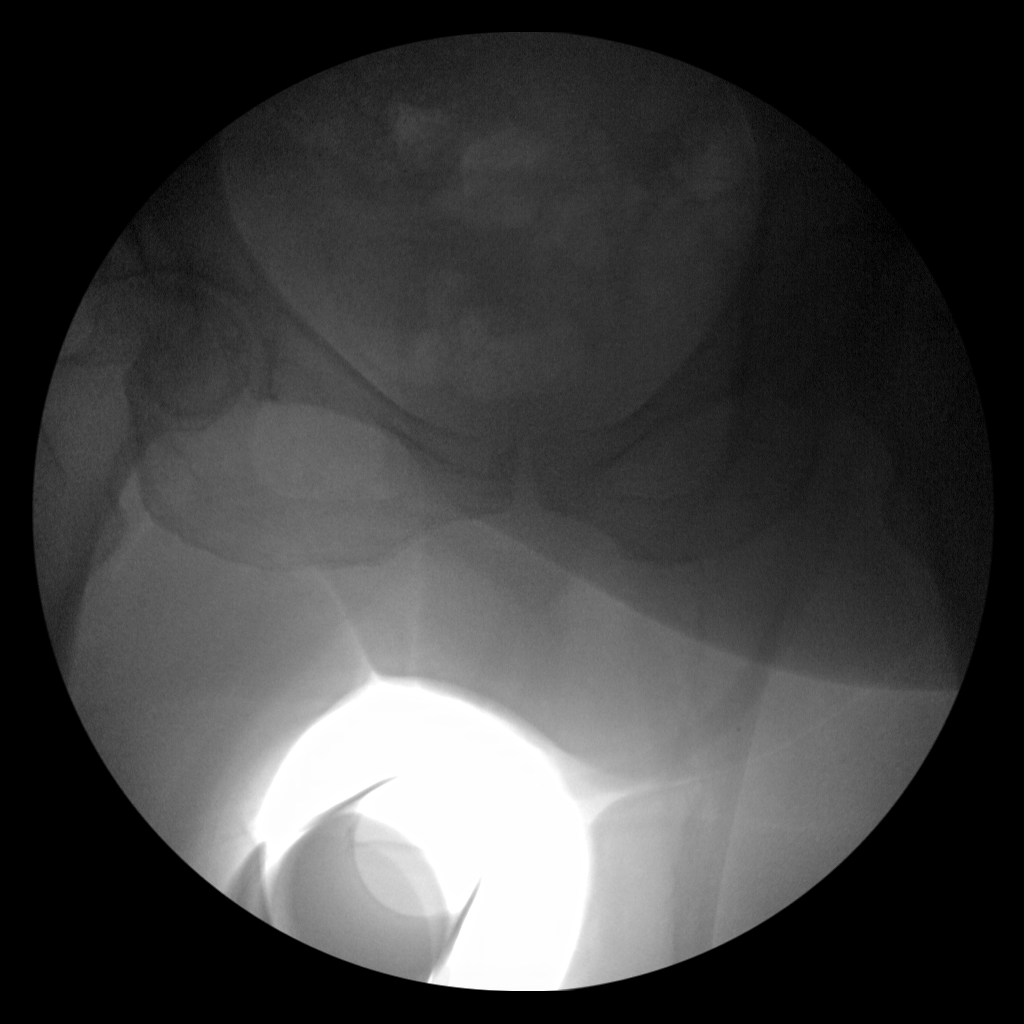
[im 2/4]
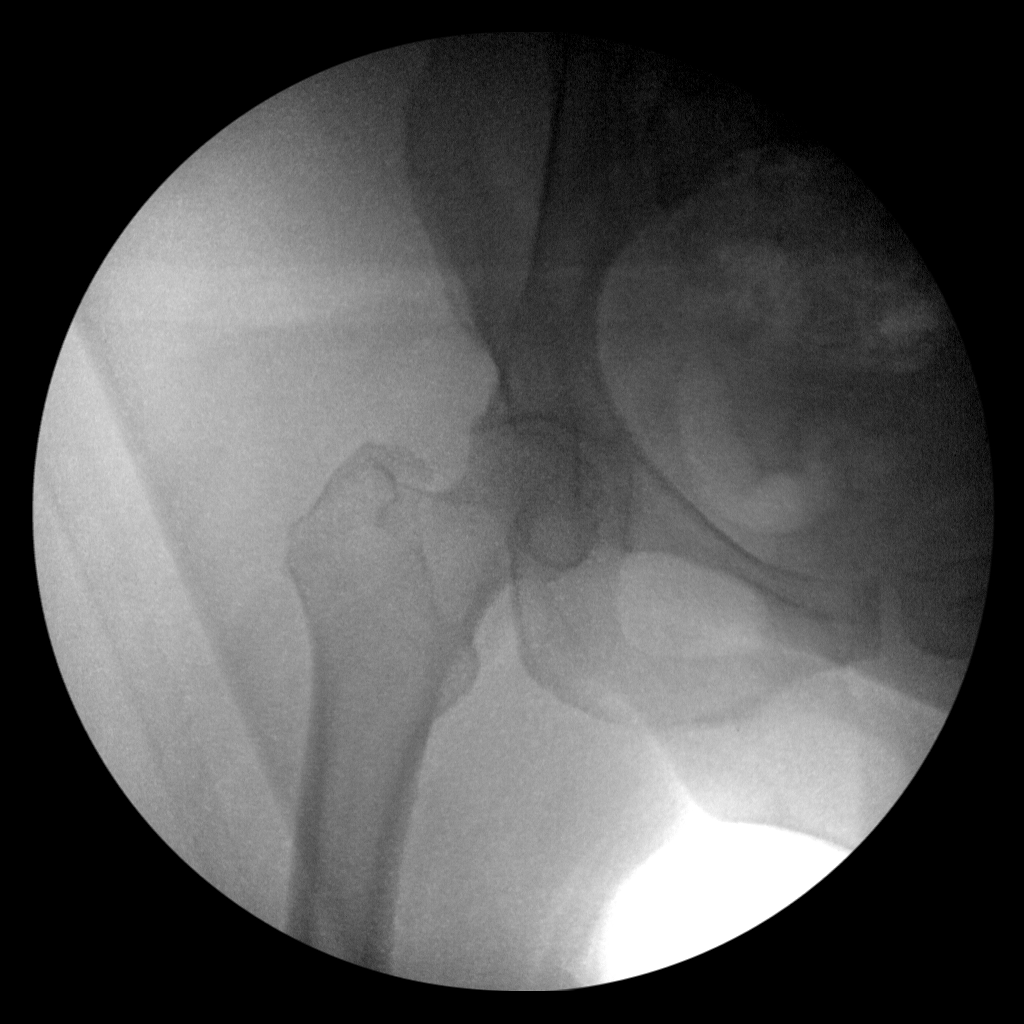
[im 3/4]
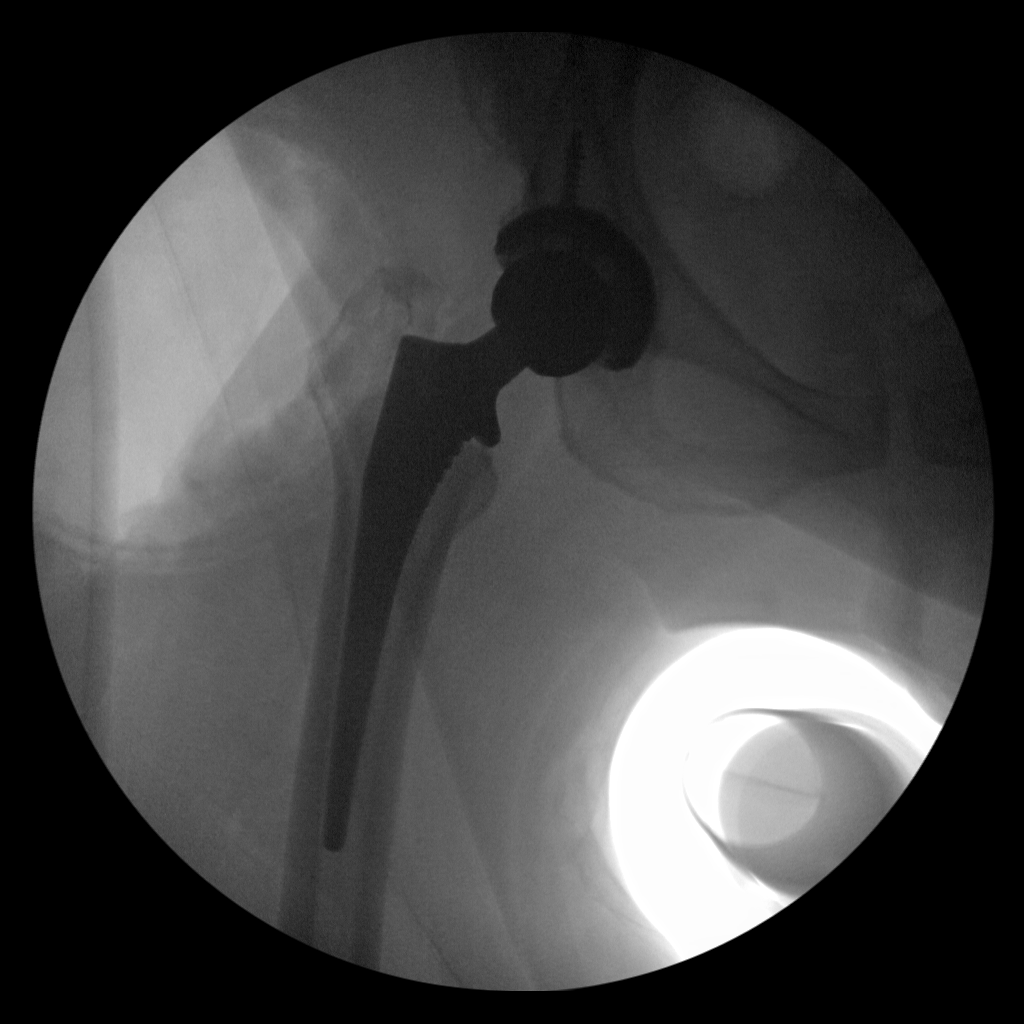
[im 4/4]
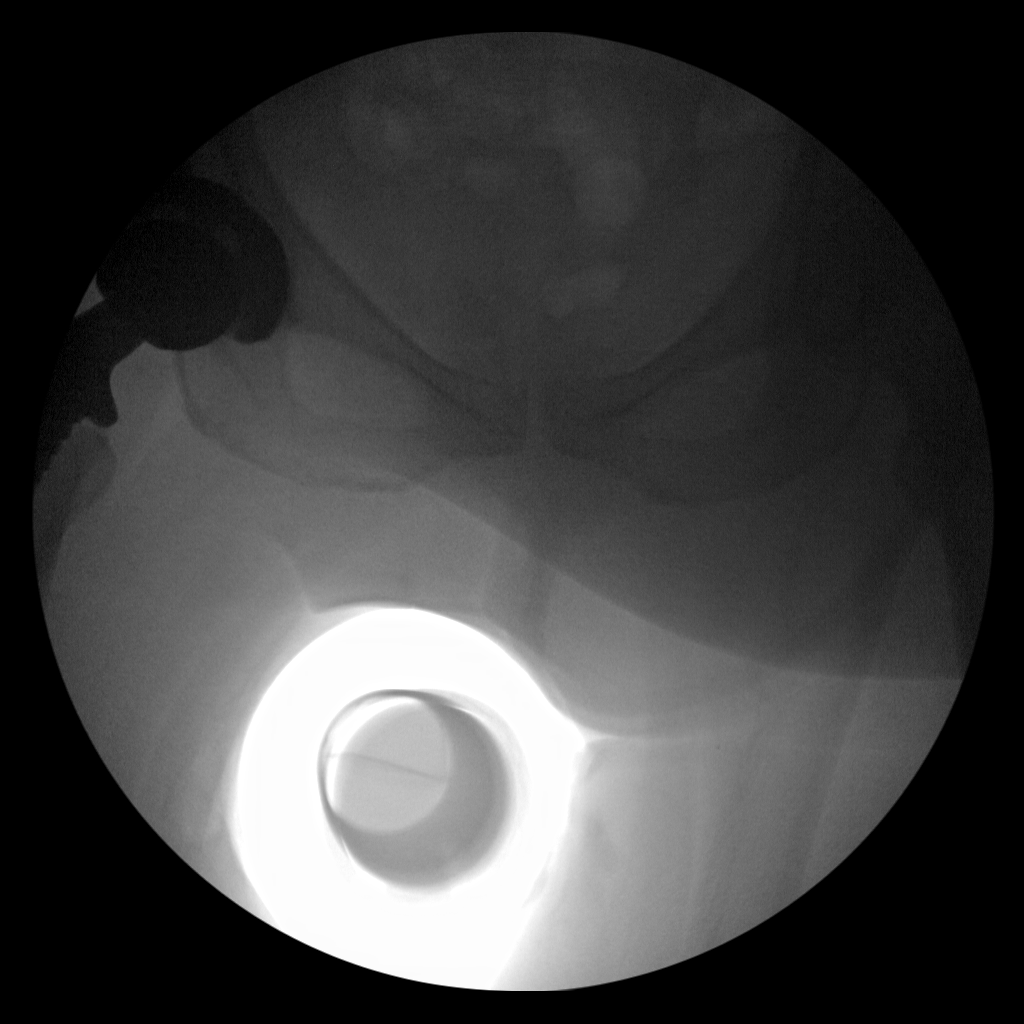

[4 of 4 positions shown; findings below may reference images not displayed]

FINDINGS: Four views of the right hip submitted. There is right hip prosthesis
with anatomic alignment.
IMPRESSION: Right hip prosthesis with anatomic alignment.

Please see the operative report. Fluoroscopy time was 1 minute 3
seconds.

## 2019-07-16 ENCOUNTER — Ambulatory Visit: Payer: Medicare Other | Attending: Internal Medicine

## 2019-07-16 DIAGNOSIS — Z23 Encounter for immunization: Secondary | ICD-10-CM | POA: Insufficient documentation

## 2019-07-16 NOTE — Progress Notes (Signed)
   Covid-19 Vaccination Clinic  Name:  MICHOLE LECUYER    MRN: [id number] DOB: 1945-06-15  07/16/2019  Ms. Dolores was observed post Covid-19 immunization for 15 minutes without incidence. She was provided with Vaccine Information Sheet and instruction to access the V-Safe system.   Ms. Pokorney was instructed to call 911 with any severe reactions post vaccine: Marland Kitchen Difficulty breathing  . Swelling of your face and throat  . A fast heartbeat  . A bad rash all over your body  . Dizziness and weakness    Immunizations Administered    Name Date Dose VIS Date Route   Pfizer COVID-19 Vaccine 07/16/2019  8:39 AM 0.3 mL 04/28/2019 Intramuscular   Manufacturer: [employer], [employer]   Lot: [id number]   NDC: [address]-5

## 2019-08-09 ENCOUNTER — Ambulatory Visit: Payer: Medicare Other | Attending: Internal Medicine

## 2019-08-09 DIAGNOSIS — Z23 Encounter for immunization: Secondary | ICD-10-CM

## 2019-08-09 NOTE — Progress Notes (Signed)
   Covid-19 Vaccination Clinic  Name:  Alexa Carson    MRN: [id number] DOB: [birth year]  08/09/2019  Ms. Bouchie was observed post Covid-19 immunization for 30 minutes based on pre-vaccination screening without incident. She was provided with Vaccine Information Sheet and instruction to access the V-Safe system.   Ms. Rashid was instructed to call 911 with any severe reactions post vaccine: Marland Kitchen Difficulty breathing  . Swelling of face and throat  . A fast heartbeat  . A bad rash all over body  . Dizziness and weakness   Immunizations Administered    Name Date Dose VIS Date Route   Pfizer COVID-19 Vaccine 08/09/2019  9:47 AM 0.3 mL 04/28/2019 Intramuscular   Manufacturer: [employer], [employer]   Lot: [id number]   NDC: [address]-4

## 2019-11-23 ENCOUNTER — Ambulatory Visit: Payer: Medicare Other | Admitting: Orthopedic Surgery

## 2019-11-27 ENCOUNTER — Ambulatory Visit (INDEPENDENT_AMBULATORY_CARE_PROVIDER_SITE_OTHER): Payer: Medicare Other | Admitting: Orthopedic Surgery

## 2019-11-27 ENCOUNTER — Ambulatory Visit: Payer: Self-pay

## 2019-11-27 ENCOUNTER — Encounter: Payer: Self-pay | Admitting: Orthopedic Surgery

## 2019-11-27 VITALS — Ht 59.5 in | Wt 296.8 lb

## 2019-11-27 DIAGNOSIS — M25552 Pain in left hip: Secondary | ICD-10-CM

## 2019-11-27 NOTE — Progress Notes (Signed)
Office Visit Note   [name]           Date of Birth: Sep 12, 1945           MRN: [id number] Visit Date: 11/27/2019 Requested by: No referring provider defined for this encounter. PCP: System, Provider Not In  Subjective: Chief Complaint  Patient presents with  . Left Hip - Pain  . Left Leg - Pain    HPI: Alexa Carson is a 74 year old patient with left hip pain is also left lateral leg pain.  She reports some left hip locking.  Feels somewhat similar to the way the right hip did prior to hip replacement.  She did have right total hip replacement in 2018.  She was approximately 50 pounds lighter than then she is now.  She reports pain which wakes her from sleep at night along with groin pain.  Not much in the way of discrete low back pain but she does have some posterior buttock pain which radiates around laterally.  She is not diabetic.  Hemoglobin A1c in the 6 range.  She does have a bladder stimulator in place but that is not helping much.  However it is preventing her from obtaining an MRI scan.  BMI currently is 59.              ROS: All systems reviewed are negative as they relate to the chief complaint within the history of present illness.  Patient denies  fevers or chills.   Assessment & Plan: Visit Diagnoses:  1. Pain in left hip     Plan: Impression is left hip pain and possible radicular pain on the left-hand side.  Her hip exam today is fairly benign in terms of no discrete loss of motion and no particular significant groin pain with internal extra rotation of the leg.  This could be referred pain from her back.  Hard to say specifically.  Nonetheless I think MRI scan is indicated on that lumbar spine as well as diagnostic hip injection.  Before any intervention could be even considered, however, she would need to lose weight.  Need to get the BMI closer to 40 range as opposed to 60 range.  She is been to work on weight loss and try to get that bladder stimulator out.  We  can scan the back at that time to see if this is any type of radicular pain.  Follow-Up Instructions: Return if symptoms worsen or fail to improve.   Orders:  Orders Placed This Encounter  Procedures  . XR HIP UNILAT W OR W/O PELVIS 2-3 VIEWS LEFT  . XR Lumbar Spine 2-3 Views   No orders of the defined types were placed in this encounter.     Procedures: No procedures performed   Clinical Data: No additional findings.  Objective: Vital Signs: Ht 4' 11.5" (1.511 m)   Wt 296 lb 12.8 oz (134.6 kg)   BMI 58.94 kg/m   Physical Exam:   Constitutional: Patient appears well-developed HEENT:  Head: Normocephalic Eyes:EOM are normal Neck: Normal range of motion Cardiovascular: Normal rate Pulmonary/chest: Effort normal Neurologic: Patient is alert Skin: Skin is warm Psychiatric: Patient has normal mood and affect    Ortho Exam: Ortho exam demonstrates not particularly Trendelenburg gait.  No groin pain with internal extra rotation of either leg.  No masses lymphadenopathy or skin changes noted in the hip region.  No trochanteric tenderness.  No definite paresthesias L1 S1 bilaterally.  Hip flexion strength 5+  out of 5 bilaterally.  Specialty Comments:  No specialty comments available.  Imaging: XR HIP UNILAT W OR W/O PELVIS 2-3 VIEWS LEFT  Result Date: 11/27/2019 AP pelvis lateral left hip reviewed.  No acute fractures present.  Right total hip prosthesis in good position alignment.  Bladder stimulator noted over the left iliac crest.  Moderate arthritis is present in the left hip.  Bony detail is obscured by soft tissue envelope.  XR Lumbar Spine 2-3 Views  Result Date: 11/27/2019 AP lateral lumbar spine radiographs reviewed.  No acute fracture.  Moderate degenerative changes noted throughout the facet joints and intradiscal spaces.  Bony detail is otherwise obscured by soft tissue envelope.    PMFS History: Patient Active Problem List   Diagnosis Date Noted  . Hip  arthritis 09/04/2016  . Chronic pain of right knee 07/22/2016  . Pain in right hip 07/22/2016   Past Medical History:  Diagnosis Date  . Arthritis   . Asthma   . History of kidney stones   . Hypertension   . Pneumonia    child  . Sleep apnea    cpap    No family history on file.  Past Surgical History:  Procedure Laterality Date  . ABDOMINAL HYSTERECTOMY  82  . BLADDER NECK SUSPENSION  82  . BREAST SURGERY  93   augmentation  . CHOLECYSTECTOMY  70  . JOINT REPLACEMENT  12   left knee  . SHOULDER ARTHROSCOPY WITH ROTATOR CUFF REPAIR Left 08/22/2013   Procedure: SHOULDER ARTHROSCOPY with debridement WITH Open ROTATOR CUFF REPAIR;  Surgeon: Cammy Copa, MD;  Location: [facility];  Service: Orthopedics;  Laterality: Left;  . TOTAL HIP ARTHROPLASTY Right 09/04/2016   Procedure: RIGHT TOTAL HIP ARTHROPLASTY ANTERIOR APPROACH;  Surgeon: Cammy Copa, MD;  Location: [facility] OR;  Service: Orthopedics;  Laterality: Right;  . TOTAL KNEE ARTHROPLASTY Right 04/04/2013   Procedure: TOTAL KNEE ARTHROPLASTY;  Surgeon: Cammy Copa, MD;  Location: [facility] OR;  Service: Orthopedics;  Laterality: Right;   Social History   Occupational History  . Not on file  Tobacco Use  . Smoking status: Never Smoker  . Smokeless tobacco: Never Used  Substance and Sexual Activity  . Alcohol use: No  . Drug use: No  . Sexual activity: Not on file

## 2022-10-28 ENCOUNTER — Ambulatory Visit: Payer: Medicare Other | Admitting: Surgical

## 2022-11-18 ENCOUNTER — Ambulatory Visit (INDEPENDENT_AMBULATORY_CARE_PROVIDER_SITE_OTHER): Payer: Medicare Other | Admitting: Orthopedic Surgery

## 2022-11-18 ENCOUNTER — Other Ambulatory Visit (INDEPENDENT_AMBULATORY_CARE_PROVIDER_SITE_OTHER): Payer: Medicare Other

## 2022-11-18 DIAGNOSIS — M79605 Pain in left leg: Secondary | ICD-10-CM

## 2022-11-19 ENCOUNTER — Encounter: Payer: Self-pay | Admitting: Orthopedic Surgery

## 2022-11-19 NOTE — Progress Notes (Signed)
Office Visit Note   [name]           Date of Birth: 06-Jul-1945           MRN: [id number] Visit Date: 11/18/2022 Requested by: No referring provider defined for this encounter. PCP: System, Provider Not In  Subjective: Chief Complaint  Patient presents with   Left Leg - Pain    HPI: Alexa Carson is a 77 y.o. female who presents to the office reporting left hip pain for 4 years.  Denies any history of injury.  Ambulating with a cane.  Does report some radicular pain going down the leg.  Severe pain with standing.  Occasional numbness and tingling in the right foot.  She is afraid of falling.  Pain does wake her from sleep at night.  She reports groin pain as well as buttock pain.  Sleeps with a pillow between her knees.  She has lost weight going from 318 pounds down to 238 pounds.  Currently she is at 78.  She has taken multiple medications to help with this weight loss.  She uses a scooter.  Has a history of bilateral total knee replacements as well as right total hip replacement done over 5 years ago..                ROS: All systems reviewed are negative as they relate to the chief complaint within the history of present illness.  Patient denies fevers or chills.  Assessment & Plan: Visit Diagnoses:  1. Pain in left leg     Plan: Impression is end-stage left hip arthritis which is likely her biggest pain generator.  She also has some spondylolisthesis at L4-5 which could be giving her some radicular pain on the right-hand side.  For her to qualify for elective joint replacement at this point in time she would need to weigh around 195 pounds.  She is going to work on that and let me know if she wants to proceed with hip replacement.  Follow-Up Instructions: No follow-ups on file.   Orders:  Orders Placed This Encounter  Procedures   XR HIP UNILAT W OR W/O PELVIS 2-3 VIEWS LEFT   XR Lumbar Spine 2-3 Views   No orders of the defined types were placed in this  encounter.     Procedures: No procedures performed   Clinical Data: No additional findings.  Objective: Vital Signs: There were no vitals taken for this visit.  Physical Exam:  Constitutional: Patient appears well-developed HEENT:  Head: Normocephalic Eyes:EOM are normal Neck: Normal range of motion Cardiovascular: Normal rate Pulmonary/chest: Effort normal Neurologic: Patient is alert Skin: Skin is warm Psychiatric: Patient has normal mood and affect  Ortho Exam: Ortho exam demonstrates diminished left hip range of motion with groin pain.  Hip flexion strength is 5+ out of 5 bilaterally.  Knees both have good range of motion with no effusion with extension full in flexion past 90 on both sides.  Trendelenburg gait present on the left-hand side.  Specialty Comments:  No specialty comments available.  Imaging: XR Lumbar Spine 2-3 Views  Result Date: 11/19/2022 AP lateral radiographs lumbar spine reviewed.  L4-5 spondylolisthesis grade 1 is present.  Arthritis is present at this level as well in the facet joints and between the vertebral bodies.  No acute fracture.  XR HIP UNILAT W OR W/O PELVIS 2-3 VIEWS LEFT  Result Date: 11/19/2022 AP pelvis lateral left hip reviewed.  Severe end-stage left hip  arthritis is present.  Right total hip prosthesis in good position alignment with no complicating features.    PMFS History: Patient Active Problem List   Diagnosis Date Noted   Hip arthritis 09/04/2016   Chronic pain of right knee 07/22/2016   Pain in right hip 07/22/2016   Past Medical History:  Diagnosis Date   Arthritis    Asthma    History of kidney stones    Hypertension    Pneumonia    child   Sleep apnea    cpap    History reviewed. No pertinent family history.  Past Surgical History:  Procedure Laterality Date   ABDOMINAL HYSTERECTOMY  43   BLADDER NECK SUSPENSION  82   BREAST SURGERY  93   augmentation   CHOLECYSTECTOMY  70   JOINT REPLACEMENT  12    left knee   SHOULDER ARTHROSCOPY WITH ROTATOR CUFF REPAIR Left 08/22/2013   Procedure: SHOULDER ARTHROSCOPY with debridement WITH Open ROTATOR CUFF REPAIR;  Surgeon: Cammy Copa, MD;  Location: [facility] OR;  Service: Orthopedics;  Laterality: Left;   TOTAL HIP ARTHROPLASTY Right 09/04/2016   Procedure: RIGHT TOTAL HIP ARTHROPLASTY ANTERIOR APPROACH;  Surgeon: Cammy Copa, MD;  Location: [facility] OR;  Service: Orthopedics;  Laterality: Right;   TOTAL KNEE ARTHROPLASTY Right 04/04/2013   Procedure: TOTAL KNEE ARTHROPLASTY;  Surgeon: Cammy Copa, MD;  Location: [facility] OR;  Service: Orthopedics;  Laterality: Right;   Social History   Occupational History   Not on file  Tobacco Use   Smoking status: Never   Smokeless tobacco: Never  Substance and Sexual Activity   Alcohol use: No   Drug use: No   Sexual activity: Not on file
